# Patient Record
Sex: Female | Born: 1985 | Race: White | Hispanic: No | Marital: Married | Smoking: Former smoker
Health system: Southern US, Community
[De-identification: ages and names within clinical notes are randomized; demographics above are authoritative.]

## PROBLEM LIST (undated history)

## (undated) DIAGNOSIS — E282 Polycystic ovarian syndrome: Secondary | ICD-10-CM

## (undated) DIAGNOSIS — E039 Hypothyroidism, unspecified: Secondary | ICD-10-CM

## (undated) DIAGNOSIS — C73 Malignant neoplasm of thyroid gland: Secondary | ICD-10-CM

## (undated) DIAGNOSIS — E041 Nontoxic single thyroid nodule: Secondary | ICD-10-CM

---

## 2015-03-18 ENCOUNTER — Emergency Department (INDEPENDENT_AMBULATORY_CARE_PROVIDER_SITE_OTHER)
Admission: EM | Admit: 2015-03-18 | Discharge: 2015-03-18 | Disposition: A | Discharge status: Home / Self Care | Payer: BLUE CROSS/BLUE SHIELD | Source: Home / Self Care | Attending: Family Medicine | Admitting: Family Medicine

## 2015-03-18 ENCOUNTER — Encounter (HOSPITAL_COMMUNITY): Payer: Self-pay | Admitting: *Deleted

## 2015-03-18 DIAGNOSIS — S29012A Strain of muscle and tendon of back wall of thorax, initial encounter: Secondary | ICD-10-CM | POA: Diagnosis not present

## 2015-03-18 MED ORDER — NAPROXEN 375 MG PO TABS: 375.0000 mg | ORAL_TABLET | Freq: Two times a day (BID) | ORAL | Status: DC

## 2015-03-18 MED ORDER — TRAMADOL HCL 50 MG PO TABS
ORAL_TABLET | ORAL | Status: DC
Start: 2015-03-18 — End: 2016-03-16

## 2015-03-18 MED ORDER — DICLOFENAC SODIUM 1 % TD GEL: 1.0000 "application " | Freq: Four times a day (QID) | TRANSDERMAL | Status: DC

## 2015-03-18 NOTE — ED Provider Notes (Signed)
CSN: [id number]     Arrival date & time 03/18/15  1901 History   First MD Initiated Contact with Patient 03/18/15 2014     Chief Complaint  Patient presents with  . Back Pain   (Consider location/radiation/quality/duration/timing/severity/associated sxs/prior Treatment) HPI Comments: Awoke with back pain today. The pain is located in a small area to the right parathoracic musculature. 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 She believes this activity promoted the pain. The pain is exacerbated by taking a deep breath, bending over or lifting. Denies shortness of breath.    History reviewed. No pertinent past medical history. History reviewed. No pertinent past surgical history. History reviewed. No pertinent family history. Social History  Substance Use Topics  . Smoking status: None  . Smokeless tobacco: None  . Alcohol Use: None   OB History    No data available     Review of Systems  Constitutional: Positive for activity change. Negative for fever and fatigue.  HENT: Negative.   Respiratory: Negative for cough, chest tightness, shortness of breath and wheezing.        Pain to the right parathoracic musculature while taking a deep breath.  Cardiovascular: Negative.  Negative for chest pain.  Gastrointestinal: Negative.   Genitourinary: Negative.   Musculoskeletal: Positive for back pain. Negative for myalgias and gait problem.  Skin: Negative.  Negative for pallor and rash.  Neurological: Negative.   Psychiatric/Behavioral: Negative.     Allergies  Review of patient's allergies indicates no known allergies.  Home Medications   Prior to Admission medications   Medication Sig Start Date End Date Taking? Authorizing Provider  diclofenac sodium (VOLTAREN) 1 % GEL Apply 1 application topically 4 (four) times daily. 03/18/15   Janne Napoleon, NP  naproxen (NAPROSYN) 375 MG tablet Take 1 tablet (375 mg total) by mouth 2 (two) times daily.  03/18/15   Janne Napoleon, NP  traMADol (ULTRAM) 50 MG tablet 1-2 tabs po q 6 hr prn pain Maximum dose= 8 tablets per day 03/18/15   Janne Napoleon, NP   Meds Ordered and Administered this Visit  Medications - No data to display  BP 122/85 mmHg  Pulse 95  Temp(Src) 99.4 F (37.4 C) (Oral)  Resp 16  SpO2 100%  LMP 03/18/2015 No data found.   Physical Exam  Constitutional: She is oriented to person, place, and time. She appears well-developed and well-nourished. No distress.  HENT:  Head: Normocephalic and atraumatic.  Eyes: EOM are normal.  Neck: Normal range of motion. Neck supple.  Cardiovascular: Normal rate, regular rhythm and normal heart sounds.   Pulmonary/Chest: Effort normal and breath sounds normal. No respiratory distress. She has no wheezes. She has no rales.  There is reproducible tenderness to the musculature of the right parathoracic muscles at level TVIII-9 in 10. Pain is also reproduced with leaning forward and arching the back and with deep breath. Lungs are perfectly clear. No crackles or wheezing. Good air movement and good chest expansion. No rubs.  Musculoskeletal: Normal range of motion. She exhibits no edema.  Lymphadenopathy:    She has no cervical adenopathy.  Neurological: She is alert and oriented to person, place, and time. No cranial nerve deficit. She exhibits normal muscle tone.  Skin: Skin is warm and dry.  Psychiatric: She has a normal mood and affect. Her behavior is normal. Thought content normal.  Nursing note and vitals reviewed.   ED Course  Procedures (  including critical care time)  Labs Review Labs Reviewed - No data to display  Imaging Review No results found.   Visual Acuity Review  Right Eye Distance:   Left Eye Distance:   Bilateral Distance:    Right Eye Near:   Left Eye Near:    Bilateral Near:         MDM   1. Strain of muscle and tendon of back wall of thorax, initial encounter    USe heat Limit lifting, pulling and  other movement that exacerbates the pain for the next few days. Meds ordered this encounter  Medications  . diclofenac sodium (VOLTAREN) 1 % GEL    Sig: Apply 1 application topically 4 (four) times daily.    Dispense:  100 g    Refill:  0    Order Specific Question:  Supervising Provider    Answer:  Billy Fischer [phone]  . naproxen (NAPROSYN) 375 MG tablet    Sig: Take 1 tablet (375 mg total) by mouth 2 (two) times daily.    Dispense:  20 tablet    Refill:  0    Order Specific Question:  Supervising Provider    Answer:  Billy Fischer [phone]  . traMADol (ULTRAM) 50 MG tablet    Sig: 1-2 tabs po q 6 hr prn pain Maximum dose= 8 tablets per day    Dispense:  20 tablet    Refill:  0    Order Specific Question:  Supervising Provider    Answer:  Billy Fischer [[id number]]       Janne Napoleon, NP 03/18/15 2038

## 2015-03-18 NOTE — Discharge Instructions (Signed)
Muscle Strain USe heat Limit lifting, pulling and other movement that exacerbates the pain for the next few days. A muscle strain is an injury that occurs when a muscle is stretched beyond its normal length. Usually a small number of muscle fibers are torn when this happens. Muscle strain is rated in degrees. First-degree strains have the least amount of muscle fiber tearing and pain. Second-degree and third-degree strains have increasingly more tearing and pain.  Usually, recovery from muscle strain takes 1-2 weeks. Complete healing takes 5-6 weeks.  CAUSES  Muscle strain happens when a sudden, violent force placed on a muscle stretches it too far. This may occur with lifting, sports, or a fall.  RISK FACTORS Muscle strain is especially common in athletes.  SIGNS AND SYMPTOMS At the site of the muscle strain, there may be:  Pain.  Bruising.  Swelling.  Difficulty using the muscle due to pain or lack of normal function. DIAGNOSIS  Your health care provider will perform a physical exam and ask about your medical history. TREATMENT  Often, the best treatment for a muscle strain is resting, icing, and applying cold compresses to the injured area.  HOME CARE INSTRUCTIONS   Use the PRICE method of treatment to promote muscle healing during the first 2-3 days after your injury. The PRICE method involves:  Protecting the muscle from being injured again.  Restricting your activity and resting the injured body part.  moist heat packs.  Apply compression to the injured area with a splint or elastic bandage. Be careful not to wrap it too tightly. This may interfere with blood circulation or increase swelling.  Elevate the injured body part above the level of your heart as often as you can.  Only take over-the-counter or prescription medicines for pain, discomfort, or fever as directed by your health care provider.  Warming up prior to exercise helps to prevent future muscle  strains. SEEK MEDICAL CARE IF:   You have increasing pain or swelling in the injured area.  You have numbness, tingling, or a significant loss of strength in the injured area. MAKE SURE YOU:   Understand these instructions.  Will watch your condition.  Will get help right away if you are not doing well or get worse.   This information is not intended to replace advice given to you by your health care provider. Make sure you discuss any questions you have with your health care provider.   Document Released: 01/16/2005 Document Revised: 11/06/2012 Document Reviewed: 08/15/2012 Elsevier Interactive Patient Education [employer].

## 2015-03-18 NOTE — ED Notes (Signed)
Back   Pain      Woke up  Today   With  The  Symptoms             denies  Any  Urinary  Symptoms  Pt  Reports      Pain is  For  The most  Part  In one  Spot  Mid  Back

## 2016-03-16 ENCOUNTER — Encounter: Payer: Self-pay | Admitting: Family Medicine

## 2016-03-16 ENCOUNTER — Ambulatory Visit (INDEPENDENT_AMBULATORY_CARE_PROVIDER_SITE_OTHER): Payer: BLUE CROSS/BLUE SHIELD | Admitting: Family Medicine

## 2016-03-16 VITALS — BP 109/67 | HR 95 | Wt 169.5 lb

## 2016-03-16 DIAGNOSIS — Z01419 Encounter for gynecological examination (general) (routine) without abnormal findings: Secondary | ICD-10-CM | POA: Diagnosis not present

## 2016-03-16 DIAGNOSIS — E041 Nontoxic single thyroid nodule: Secondary | ICD-10-CM | POA: Insufficient documentation

## 2016-03-16 DIAGNOSIS — N911 Secondary amenorrhea: Secondary | ICD-10-CM | POA: Insufficient documentation

## 2016-03-16 DIAGNOSIS — Z0000 Encounter for general adult medical examination without abnormal findings: Secondary | ICD-10-CM

## 2016-03-16 DIAGNOSIS — Z01411 Encounter for gynecological examination (general) (routine) with abnormal findings: Secondary | ICD-10-CM

## 2016-03-16 DIAGNOSIS — Z124 Encounter for screening for malignant neoplasm of cervix: Secondary | ICD-10-CM

## 2016-03-16 LAB — COMPREHENSIVE METABOLIC PANEL
ALT: 15 U/L (ref 6–29)
AST: 20 U/L (ref 10–30)
Albumin: 4 g/dL (ref 3.6–5.1)
Alkaline Phosphatase: 69 U/L (ref 33–115)
BUN: 10 mg/dL (ref 7–25)
CHLORIDE: 104 mmol/L (ref 98–110)
CO2: 26 mmol/L (ref 20–31)
CREATININE: 0.61 mg/dL (ref 0.50–1.10)
Calcium: 9.2 mg/dL (ref 8.6–10.2)
GLUCOSE: 80 mg/dL (ref 65–99)
Potassium: 4.4 mmol/L (ref 3.5–5.3)
SODIUM: 139 mmol/L (ref 135–146)
Total Bilirubin: 0.2 mg/dL (ref 0.2–1.2)
Total Protein: 7.6 g/dL (ref 6.1–8.1)

## 2016-03-16 LAB — LIPID PANEL
Cholesterol: 161 mg/dL (ref <200)
HDL: 30 mg/dL — AB (ref >50)
LDL CALC: 102 mg/dL — AB (ref <100)
Total CHOL/HDL Ratio: 5.4 Ratio — ABNORMAL HIGH (ref <5.0)
Triglycerides: 143 mg/dL (ref <150)
VLDL: 29 mg/dL (ref <30)

## 2016-03-16 LAB — CBC
HEMATOCRIT: 38.9 % (ref 35.0–45.0)
Hemoglobin: 12.6 g/dL (ref 11.7–15.5)
MCH: 27.8 pg (ref 27.0–33.0)
MCHC: 32.4 g/dL (ref 32.0–36.0)
MCV: 85.9 fL (ref 80.0–100.0)
MPV: 10.3 fL (ref 7.5–12.5)
Platelets: 346 10*3/uL (ref 140–400)
RBC: 4.53 MIL/uL (ref 3.80–5.10)
RDW: 13.6 % (ref 11.0–15.0)
WBC: 6.7 10*3/uL (ref 3.8–10.8)

## 2016-03-16 LAB — TSH: TSH: 0.98 m[IU]/L

## 2016-03-16 NOTE — Assessment & Plan Note (Signed)
Will check labs. Reports normal pelvic sono in last few years, without ovarian findings.

## 2016-03-16 NOTE — Progress Notes (Signed)
Subjective:     Linda Edwards is a 31 y.o. female and is here for a comprehensive physical exam. The patient reports problems - irregular menses. Reports cycles started at age 5. Initially regular. On OC's x 10 years. Cycles started spacing out to q 2-3 months. More recently has gone up to 1 year with no cycle. Reports previous w/u with OB/GYN that showed low estrogen. Placed on some medication which started her cycle. Stated she could not be on it longer than 6 months as it would lead to endometrial cancer. She has no family history of premature menopause. Has always been slightly overweight. No heavy athletics. Denies hot flashes.  Social History   Social History  . Marital status: Married    Spouse name: N/A  . Number of children: N/A  . Years of education: N/A   Occupational History  . Not on file.   Social History Main Topics  . Smoking status: Not on file  . Smokeless tobacco: Not on file  . Alcohol use Not on file  . Drug use: Unknown  . Sexual activity: Yes    Birth control/ protection: None   Other Topics Concern  . Not on file   Social History Narrative  . No narrative on file   Health Maintenance  Topic Date Due  . HIV Screening  02/19/2001  . TETANUS/TDAP  02/19/2005  . PAP SMEAR  02/20/2007  . INFLUENZA VACCINE  08/31/2015    The following portions of the patient's history were reviewed and updated as appropriate: allergies, current medications, past family history, past medical history, past social history, past surgical history and problem list.  Review of Systems Pertinent items noted in HPI and remainder of comprehensive ROS otherwise negative.   Objective:    BP 109/67   Pulse 95   Wt 169 lb 8 oz (76.9 kg)  General appearance: alert, cooperative and appears stated age Head: Normocephalic, without obvious abnormality, atraumatic Neck: no adenopathy, supple, symmetrical, trachea midline and thyroid: solitary nodule right side and 2-3 cm Lungs:  clear to auscultation bilaterally Breasts: normal appearance, no masses or tenderness Heart: regular rate and rhythm, S1, S2 normal, no murmur, click, rub or gallop Abdomen: soft, non-tender; bowel sounds normal; no masses,  no organomegaly Pelvic: cervix normal in appearance, external genitalia normal, no adnexal masses or tenderness, no cervical motion tenderness, uterus normal size, shape, and consistency and vagina normal without discharge Extremities: extremities normal, atraumatic, no cyanosis or edema Pulses: 2+ and symmetric Skin: Skin color, texture, turgor normal. No rashes or lesions Lymph nodes: Cervical, supraclavicular, and axillary nodes normal. Neurologic: Grossly normal    Assessment:    GYN female exam.      Plan:   Problem List Items Addressed This Visit      Unprioritized   Secondary amenorrhea - Primary    Will check labs. Reports normal pelvic sono in last few years, without ovarian findings.      Relevant Orders   Follicle stimulating hormone   TSH   Prolactin   Thyroid nodule    Will check sono--likely needs biopsy      Relevant Orders   US THYROID    Other Visit Diagnoses    Encounter for gynecological examination with abnormal finding       Relevant Orders   Comprehensive metabolic panel   CBC   Lipid panel   Screening for cervical cancer       Relevant Orders   Cytology - PAP  Declines flu See After Visit Summary for Counseling Recommendations

## 2016-03-16 NOTE — Assessment & Plan Note (Signed)
Will check sono--likely needs biopsy

## 2016-03-16 NOTE — Patient Instructions (Signed)
Secondary Amenorrhea Secondary amenorrhea is the stopping of menstrual flow for 3-6 months in a female who has previously had periods. There are many possible causes. Most of these causes are not serious. Usually, treating the underlying problem causing the loss of menses will return your periods to normal. What are the causes? Some common and uncommon causes of not menstruating include:  Malnutrition.  Low blood sugar (hypoglycemia).  Polycystic ovary disease.  Stress or fear.  Breastfeeding.  Hormone imbalance.  Ovarian failure.  Medicines.  Extreme obesity.  Cystic fibrosis.  Low body weight or drastic weight reduction from any cause.  Early menopause.  Removal of ovaries or uterus.  Contraceptives.  Illness.  Long-term (chronic) illnesses.  Cushing syndrome.  Thyroid problems.  Birth control pills, patches, or vaginal rings for birth control. What increases the risk? You may be at greater risk of secondary amenorrhea if:  You have a family history of this condition.  You have an eating disorder.  You do athletic training. How is this diagnosed? A diagnosis is made by your health care provider taking a medical history and doing a physical exam. This will include a pelvic exam to check for problems with your reproductive organs. Pregnancy must be ruled out. Often, numerous blood tests are done to measure different hormones in the body. Urine testing may be done. Specialized exams (ultrasound, CT scan, MRI, or hysteroscopy) may have to be done as well as measuring the body mass index (BMI). How is this treated? Treatment depends on the cause of the amenorrhea. If an eating disorder is present, this can be treated with an adequate diet and therapy. Chronic illnesses may improve with treatment of the illness. Amenorrhea may be corrected with medicines, lifestyle changes, or surgery. If the amenorrhea cannot be corrected, it is sometimes possible to create a false  menstruation with medicines. Follow these instructions at home:  Maintain a healthy diet.  Manage weight problems.  Exercise regularly but not excessively.  Get adequate sleep.  Manage stress.  Be aware of changes in your menstrual cycle. Keep a record of when your periods occur. Note the date your period starts, how long it lasts, and any problems. Contact a health care provider if: Your symptoms do not get better with treatment. This information is not intended to replace advice given to you by your health care provider. Make sure you discuss any questions you have with your health care provider. Document Released: 02/27/2006 Document Revised: 06/24/2015 Document Reviewed: 07/04/2012 Elsevier Interactive Patient Education  2017 [employer]. Preventive Care 18-39 Years, Female Preventive care refers to lifestyle choices and visits with your health care provider that can promote health and wellness. What does preventive care include?  A yearly physical exam. This is also called an annual well check.  Dental exams once or twice a year.  Routine eye exams. Ask your health care provider how often you should have your eyes checked.  Personal lifestyle choices, including:  Daily care of your teeth and gums.  Regular physical activity.  Eating a healthy diet.  Avoiding tobacco and drug use.  Limiting alcohol use.  Practicing safe sex.  Taking vitamin and mineral supplements as recommended by your health care provider. What happens during an annual well check? The services and screenings done by your health care provider during your annual well check will depend on your age, overall health, lifestyle risk factors, and family history of disease. Counseling  Your health care provider may ask you questions about your:  Alcohol use.  Tobacco use.  Drug use.  Emotional well-being.  Home and relationship well-being.  Sexual activity.  Eating habits.  Work and work  Statistician.  Method of birth control.  Menstrual cycle.  Pregnancy history. Screening  You may have the following tests or measurements:  Height, weight, and BMI.  Diabetes screening. This is done by checking your blood sugar (glucose) after you have not eaten for a while (fasting).  Blood pressure.  Lipid and cholesterol levels. These may be checked every 5 years starting at age 49.  Skin check.  Hepatitis C blood test.  Hepatitis B blood test.  Sexually transmitted disease (STD) testing.  BRCA-related cancer screening. This may be done if you have a family history of breast, ovarian, tubal, or peritoneal cancers.  Pelvic exam and Pap test. This may be done every 3 years starting at age 6. Starting at age 64, this may be done every 5 years if you have a Pap test in combination with an HPV test. Discuss your test results, treatment options, and if necessary, the need for more tests with your health care provider. Vaccines  Your health care provider may recommend certain vaccines, such as:  Influenza vaccine. This is recommended every year.  Tetanus, diphtheria, and acellular pertussis (Tdap, Td) vaccine. You may need a Td booster every 10 years.  Varicella vaccine. You may need this if you have not been vaccinated.  HPV vaccine. If you are 39 or younger, you may need three doses over 6 months.  Measles, mumps, and rubella (MMR) vaccine. You may need at least one dose of MMR. You may also need a second dose.  Pneumococcal 13-valent conjugate (PCV13) vaccine. You may need this if you have certain conditions and were not previously vaccinated.  Pneumococcal polysaccharide (PPSV23) vaccine. You may need one or two doses if you smoke cigarettes or if you have certain conditions.  Meningococcal vaccine. One dose is recommended if you are age 28-21 years and a first-year college student living in a residence hall, or if you have one of several medical conditions. You may  also need additional booster doses.  Hepatitis A vaccine. You may need this if you have certain conditions or if you travel or work in places where you may be exposed to hepatitis A.  Hepatitis B vaccine. You may need this if you have certain conditions or if you travel or work in places where you may be exposed to hepatitis B.  Haemophilus influenzae type b (Hib) vaccine. You may need this if you have certain risk factors. Talk to your health care provider about which screenings and vaccines you need and how often you need them. This information is not intended to replace advice given to you by your health care provider. Make sure you discuss any questions you have with your health care provider. Document Released: 03/14/2001 Document Revised: 10/06/2015 Document Reviewed: 11/17/2014 Elsevier Interactive Patient Education  2017 [employer].

## 2016-03-16 NOTE — Progress Notes (Signed)
Subjective:     Patient ID: Linda Edwards, female   DOB: 1986-01-01, 31 y.o.   MRN: [id number]  HPI Linda Edwards is a 31 yo woman presenting for annual check up and pap smear.  Her last pap smear was 2-3 years ago and she has never had an abnormal pap smear.  She denies vaginal discharge, irritation, dysuria, increased urinary frequency, abdominal pain or hot flashes.  She also c/o irregular periods for the past 4-5 years.  Initially she had a period every 2-3 months but overtime the interval between cycles has increased to 1 year.  She had menarche age at 65-14 and initially had regular periods.  She was on OCP between age [age] but discontinued them once her cycles became irregular.  Her LMP began 03/09/16 and the period prior to that was in 03/2015.  Her periods tend to be light when they occur, but her most recent one began with heavy bleeding and cramping for 3 days.  She had a workup with her OB in [place] 4-5 years ago when her irregularity began and was told she had low estrogen.  She was given a medication that she cannot recall the name of that put her back on regular cycles for a few months.  She has since discontinued that medication.  She had normal U/S of her RLQ abdomen and ovaries several years ago to work up RLQ abdominal pain that has since resolved without any intervention.  Pt is currently sexually active in a monogamous relationship with her husband and uses no barrier protection or contraception.  She has never been pregnant.  She is currently not hoping to become pregnant and would prefer not to take medications to help with fertility.  Review of Systems     Objective:   Physical Exam Gen: comfortable, in NAD Neck: enlarged thyroid nodule R > L, non-tender to palpation, mobile with swallowing Cv: RRR, no murmur, rubs or gallops Pulm: clear to auscultation bilaterally, no increased WOB, no crackles or wheezes. Abd: normoactive bowel sounds, non-distended, non-tender to  palpation, no hepatosplenomegaly Breast: normal external appearance, no lymphadenopathy, no masses or lesions noted Pelvic: normal external female genitalia, small bleeding from cerical os, normal vaginal discharge    Assessment:     Linda Edwards is a 31 yo woman presenting for establishment of care, pap smear and evaluation of irregular periods.  Differential for her secondary amenorrhea includes thyroid disorder, PCOS and pituitary dysfunction.  Enlarged thyroid on exam supports thyroid problems, but further workup is necessary to narrow the differential.    Plan:     -Check CBC, CMP, lipid panel -Check FSH, TSH, prolactin to work up irregular cycles -U/S of thyroid to work up enlarged thyroid on exam -Performed breast exam, pelvic exam and pap smear today

## 2016-03-17 LAB — FOLLICLE STIMULATING HORMONE: FSH: 3.5 m[IU]/mL

## 2016-03-17 LAB — PROLACTIN: PROLACTIN: 6.2 ng/mL

## 2016-03-20 LAB — CYTOLOGY - PAP
DIAGNOSIS: NEGATIVE
HPV (WINDOPATH): NOT DETECTED

## 2016-03-22 ENCOUNTER — Ambulatory Visit (HOSPITAL_COMMUNITY)
Admission: RE | Admit: 2016-03-22 | Discharge: 2016-03-22 | Disposition: A | Discharge status: Home / Self Care | Payer: BLUE CROSS/BLUE SHIELD | Source: Ambulatory Visit | Attending: Family Medicine | Admitting: Family Medicine

## 2016-03-22 DIAGNOSIS — E041 Nontoxic single thyroid nodule: Secondary | ICD-10-CM

## 2016-03-22 DIAGNOSIS — E042 Nontoxic multinodular goiter: Secondary | ICD-10-CM | POA: Diagnosis not present

## 2016-03-23 ENCOUNTER — Telehealth: Payer: Self-pay | Admitting: General Practice

## 2016-03-23 DIAGNOSIS — N911 Secondary amenorrhea: Secondary | ICD-10-CM

## 2016-03-23 DIAGNOSIS — E049 Nontoxic goiter, unspecified: Secondary | ICD-10-CM

## 2016-03-23 NOTE — Telephone Encounter (Signed)
Per Dr Kennon Rounds, patient needs referral to ENT. Scheduled with Dr Kasandra Knudsen Jonathon Bellows Teoh on 3/6 @ 1pm. Called and informed patient. Patient verbalized understanding and asked what her labs results were, if she needed a pelvic ultrasound and if she should come back to see [place] on 3/8 still. Told patient I was not sure but I would message Dr Kennon Rounds and call her back whenever I hear something. Patient verbalized understanding and had no questions

## 2016-03-23 NOTE — Telephone Encounter (Signed)
Per Dr Kennon Rounds, patient's labs appear normal and she should have a pelvic ultrasound prior to her next appt in our office. Scheduled ultrasound for 2/28 1115. Called patient and informed her of all the above. Patient verbalized understanding to all & had no questions

## 2016-03-27 ENCOUNTER — Other Ambulatory Visit: Payer: Self-pay | Admitting: General Practice

## 2016-03-27 DIAGNOSIS — N911 Secondary amenorrhea: Secondary | ICD-10-CM

## 2016-03-28 ENCOUNTER — Encounter: Payer: Self-pay | Admitting: Family Medicine

## 2016-03-28 ENCOUNTER — Ambulatory Visit (HOSPITAL_COMMUNITY)
Admission: RE | Admit: 2016-03-28 | Discharge: 2016-03-28 | Disposition: A | Discharge status: Home / Self Care | Payer: BLUE CROSS/BLUE SHIELD | Source: Ambulatory Visit | Attending: Family Medicine | Admitting: Family Medicine

## 2016-03-28 DIAGNOSIS — N911 Secondary amenorrhea: Secondary | ICD-10-CM | POA: Diagnosis not present

## 2016-03-28 DIAGNOSIS — E282 Polycystic ovarian syndrome: Secondary | ICD-10-CM | POA: Insufficient documentation

## 2016-03-28 DIAGNOSIS — N912 Amenorrhea, unspecified: Secondary | ICD-10-CM | POA: Diagnosis not present

## 2016-03-28 DIAGNOSIS — N854 Malposition of uterus: Secondary | ICD-10-CM | POA: Diagnosis not present

## 2016-03-28 DIAGNOSIS — N926 Irregular menstruation, unspecified: Secondary | ICD-10-CM | POA: Insufficient documentation

## 2016-03-29 ENCOUNTER — Ambulatory Visit (HOSPITAL_COMMUNITY): Payer: BLUE CROSS/BLUE SHIELD

## 2016-04-04 ENCOUNTER — Other Ambulatory Visit (INDEPENDENT_AMBULATORY_CARE_PROVIDER_SITE_OTHER): Payer: Self-pay | Admitting: Otolaryngology

## 2016-04-04 DIAGNOSIS — E041 Nontoxic single thyroid nodule: Secondary | ICD-10-CM

## 2016-04-06 ENCOUNTER — Encounter: Payer: Self-pay | Admitting: Family Medicine

## 2016-04-06 ENCOUNTER — Ambulatory Visit (INDEPENDENT_AMBULATORY_CARE_PROVIDER_SITE_OTHER): Payer: BLUE CROSS/BLUE SHIELD | Admitting: Family Medicine

## 2016-04-06 VITALS — BP 120/64 | HR 69 | Ht 62.0 in | Wt 170.6 lb

## 2016-04-06 DIAGNOSIS — E282 Polycystic ovarian syndrome: Secondary | ICD-10-CM

## 2016-04-06 MED ORDER — MEDROXYPROGESTERONE ACETATE 10 MG PO TABS: 10.0000 mg | ORAL_TABLET | Freq: Every day | ORAL | 3 refills | Status: DC

## 2016-04-06 NOTE — Patient Instructions (Signed)
Polycystic Kidney Disease, Adult Polycystic kidney disease is a disease in which the kidneys grow many small fluid-filled cysts. These cysts squeeze healthy kidney tissue, hurting the function of the kidneys. Polycystic kidney disease is present at birth and often causes progressive loss of kidney function and high blood pressure (hypertension). In milder forms of the disease, kidney function may be adequate throughout life. What are the causes? The condition is usually inherited from parents. What are the signs or symptoms?  Hypertension.  Not enough healthy red blood cells to carry adequate oxygen to your tissues (anemia).  Pain in middle and side of the back below ribs (flank pain). This can happen if a cyst is bleeding.  Blood in the urine.  Kidney failure.  Kidney stones.  Increased urination at night.  Liver disease and liver cysts. How is this diagnosed? Your health care provider will ask you about your history and symptoms and perform a physical exam. Tests may be done to diagnose the condition. They may include an ultrasound, a CT scan, or an MRI scan. Sometimes chromosome studies are done to see if you have the genes to have polycystic kidneys. How is this treated?  There is no treatment at this time to keep cysts from forming or getting larger.  Cysts may need to be drained with a needle if they are large, putting pressure on other organs, and further destroying kidney tissue. This may require surgery.  Hypertension may be treated with medicines. Treating hypertension slows down further damage to the kidneys.  If kidney failure occurs, dialysis or transplantation is used to treat the disease. Follow these instructions at home: You should follow up with a kidney specialist (nephrologist) so that your kidney function is monitored. Contact a health care provider if:  You have severe pain in the flank area.  You have blood in your urine. This information is not intended  to replace advice given to you by your health care provider. Make sure you discuss any questions you have with your health care provider. Document Released: 10/11/2000 Document Revised: 06/24/2015 Document Reviewed: 06/20/2012 Elsevier Interactive Patient Education  2017 [employer] Inc[employer]  

## 2016-04-07 NOTE — Assessment & Plan Note (Signed)
Lengthy discussion had with pt. to explain PCOS, diagrams and verbal communication used to show impact on ovaries, endometrium, need for endometrial protection, impact on fertility, medical treatments to achieve necessary endpoints.  Should be on cyclical progestin vs. OC's if not trying to get pregnant.  Should be on Femara or clomid if trying to conceive. Desires cyclic progestin at this time.

## 2016-04-07 NOTE — Progress Notes (Signed)
   Subjective:    Patient ID: Linda Edwards is a 31 y.o. female presenting with Follow-up  on 04/06/2016  HPI: Patient here to f/u previous annual. Since last visit, has had full cycle which was heavy and painful. Pelvic sono supported PCOS as did labs.   Review of Systems  Constitutional: Negative for chills and fever.  Respiratory: Negative for shortness of breath.   Cardiovascular: Negative for chest pain.  Gastrointestinal: Negative for abdominal pain, nausea and vomiting.  Genitourinary: Negative for dysuria.  Skin: Negative for rash.      Objective:    BP 120/64   Pulse 69   Ht 5\' 2"  (1.575 m)   Wt 170 lb 9.6 oz (77.4 kg)   BMI 31.20 kg/m  Physical Exam  Constitutional: She is oriented to person, place, and time. She appears well-developed and well-nourished. No distress.  HENT:  Head: Normocephalic and atraumatic.  Eyes: No scleral icterus.  Neck: Neck supple.  Cardiovascular: Normal rate.   Pulmonary/Chest: Effort normal.  Abdominal: Soft.  Neurological: She is alert and oriented to person, place, and time.  Skin: Skin is warm and dry.  Psychiatric: She has a normal mood and affect.        Assessment & Plan:   Problem List Items Addressed This Visit      Unprioritized   PCOS (polycystic ovarian syndrome) - Primary    Lengthy discussion had with pt. to explain PCOS, diagrams and verbal communication used to show impact on ovaries, endometrium, need for endometrial protection, impact on fertility, medical treatments to achieve necessary endpoints.  Should be on cyclical progestin vs. OC's if not trying to get pregnant.  Should be on Femara or clomid if trying to conceive. Desires cyclic progestin at this time.       Relevant Medications   medroxyPROGESTERone (PROVERA) 10 MG tablet      Total face-to-face time with patient: 15 minutes. Over 50% of encounter was spent on counseling and coordination of care. Return in about 3 months (around  07/07/2016).  Donnamae Jude 04/07/2016 9:25 AM

## 2016-04-13 ENCOUNTER — Ambulatory Visit
Admission: RE | Admit: 2016-04-13 | Discharge: 2016-04-13 | Disposition: A | Discharge status: Home / Self Care | Payer: BLUE CROSS/BLUE SHIELD | Source: Ambulatory Visit | Attending: Otolaryngology | Admitting: Otolaryngology

## 2016-04-13 ENCOUNTER — Other Ambulatory Visit (HOSPITAL_COMMUNITY)
Admission: RE | Admit: 2016-04-13 | Discharge: 2016-04-13 | Disposition: A | Discharge status: Home / Self Care | Payer: BLUE CROSS/BLUE SHIELD | Source: Ambulatory Visit | Attending: Radiology | Admitting: Radiology

## 2016-04-13 DIAGNOSIS — E041 Nontoxic single thyroid nodule: Secondary | ICD-10-CM | POA: Diagnosis not present

## 2016-04-13 DIAGNOSIS — E079 Disorder of thyroid, unspecified: Secondary | ICD-10-CM | POA: Diagnosis not present

## 2016-04-13 DIAGNOSIS — R896 Abnormal cytological findings in specimens from other organs, systems and tissues: Secondary | ICD-10-CM | POA: Insufficient documentation

## 2016-04-13 IMAGING — US US THYROID BIOPSY
1 series · 13 of 13 positions shown · non-contrast
Comparison: US Thyroid [DATE]

MEDICATIONS:
10 cc 1% lidocaine

COMPLICATIONS:
None immediate.

INDICATION: Indeterminate thyroid nodule

5 cm right thyroid nodule biopsy
EXAM:
ULTRASOUND GUIDED FINE NEEDLE ASPIRATION OF INDETERMINATE THYROID
NODULE
TECHNIQUE: Informed written consent was obtained from the patient after a
discussion of the risks, benefits and alternatives to treatment.
Questions regarding the procedure were encouraged and answered. A
timeout was performed prior to the initiation of the procedure.

[Series 1: us thyroid biopsy · 0.08mm/px · 13 acquisitions, 13 frames shown]
[im 1/13]
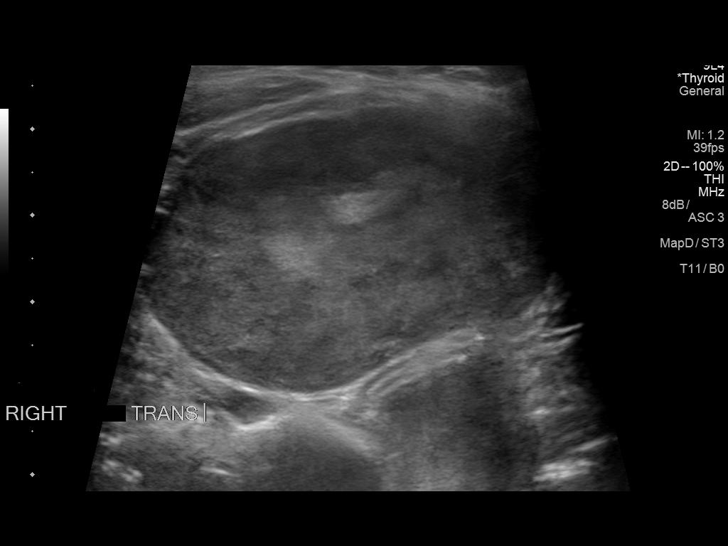
[im 2/13]
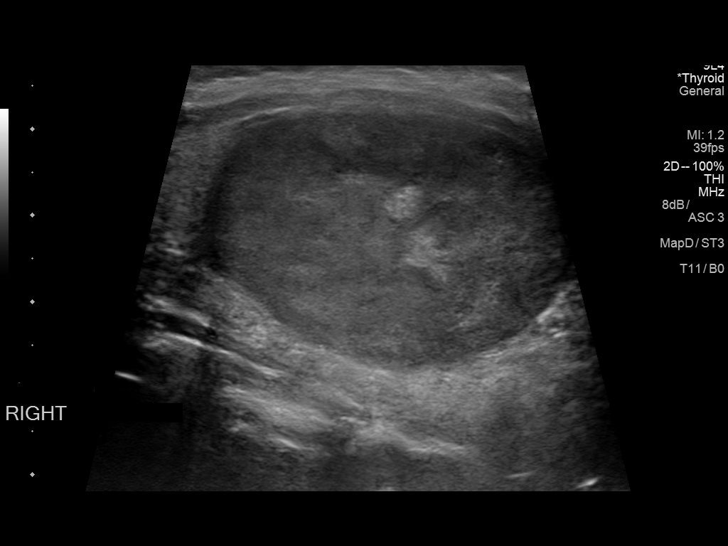
[im 3/13]
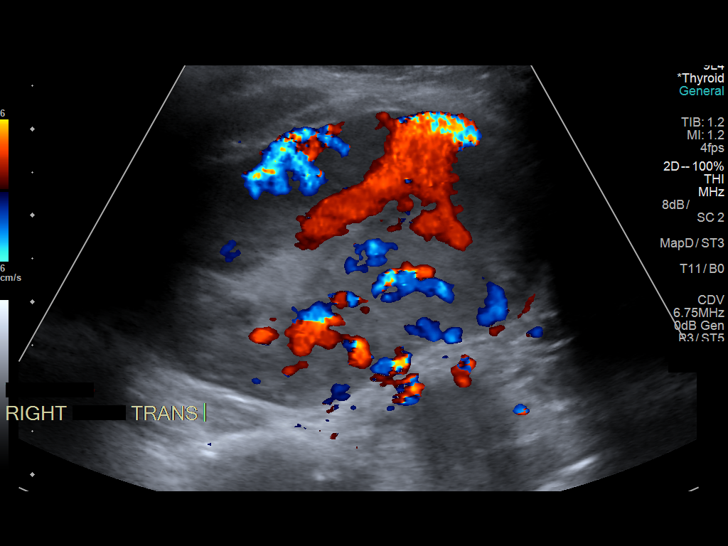
[im 4/13]
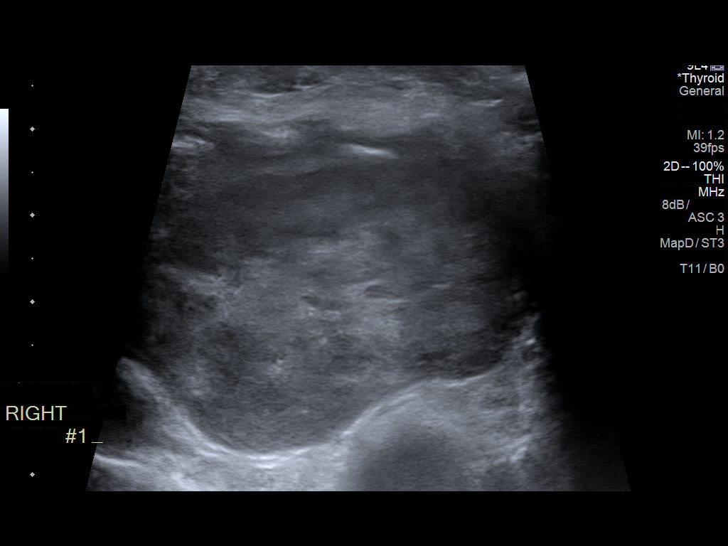
[im 5/13]
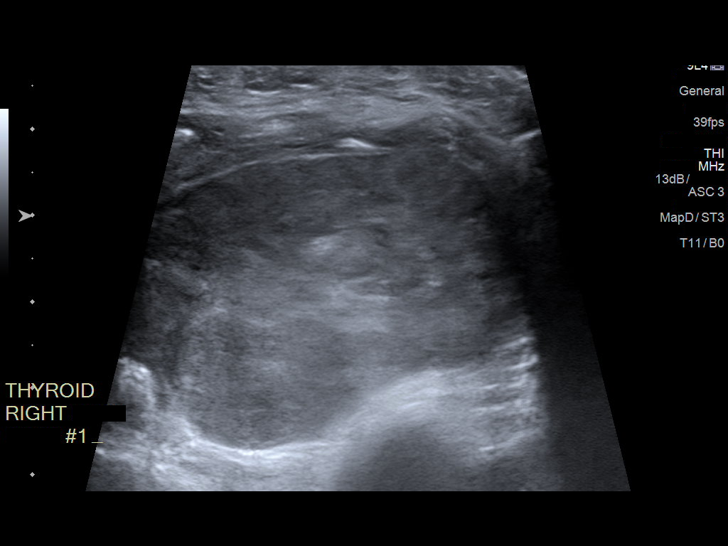
[im 6/13]
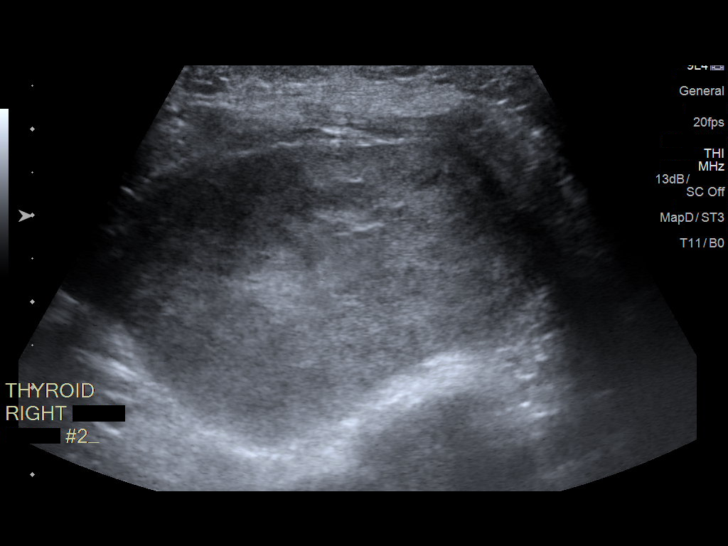
[im 7/13]
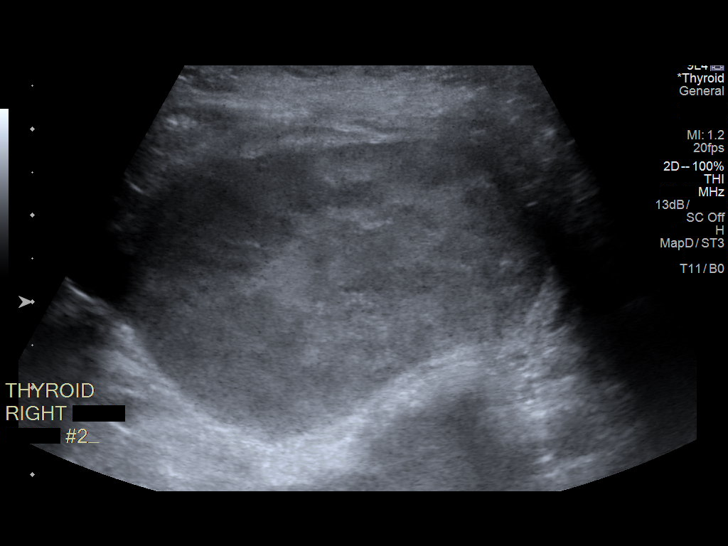
[im 8/13]
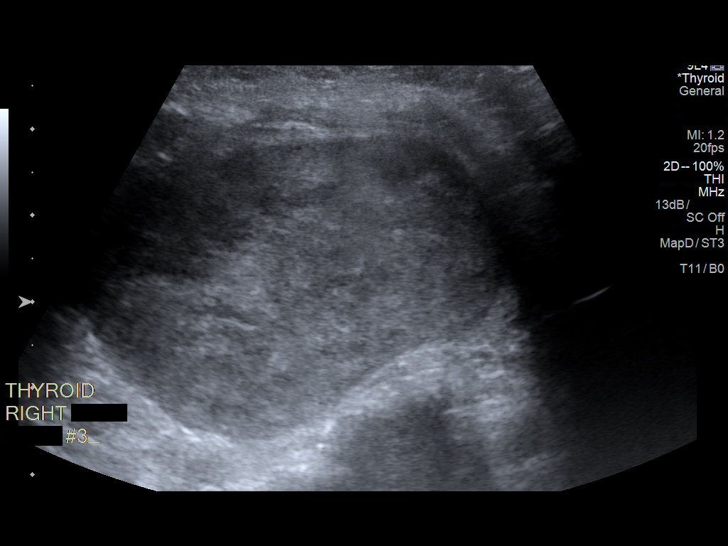
[im 9/13]
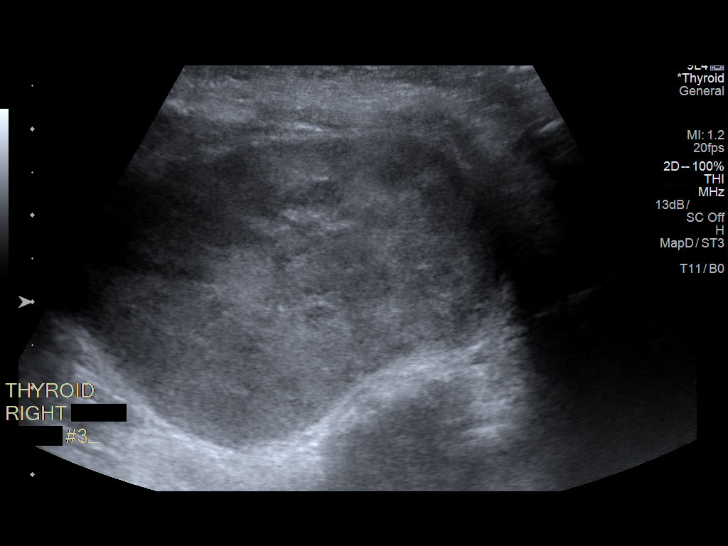
[im 10/13]
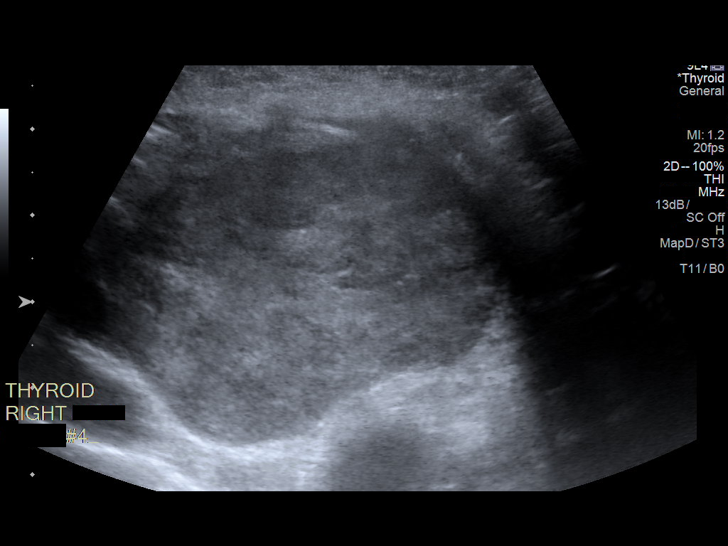
[im 11/13]
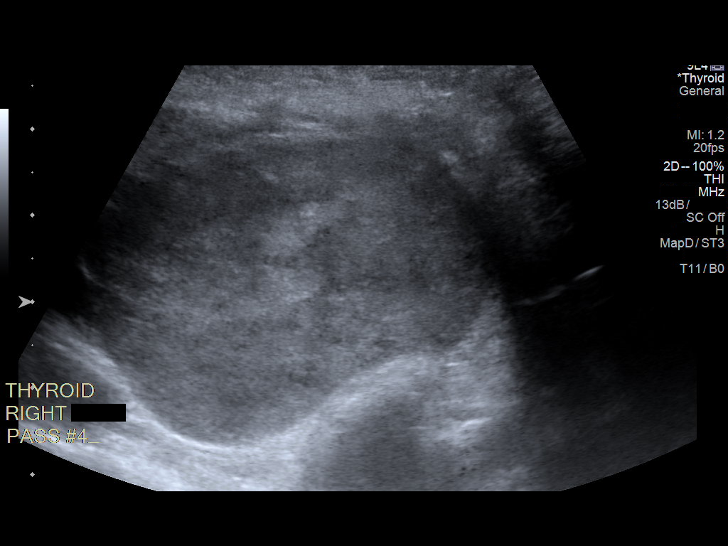
[im 12/13]
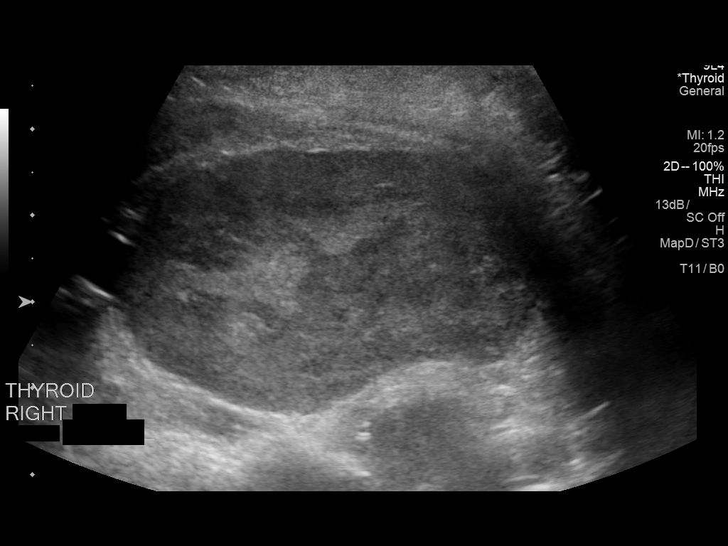
[im 13/13]
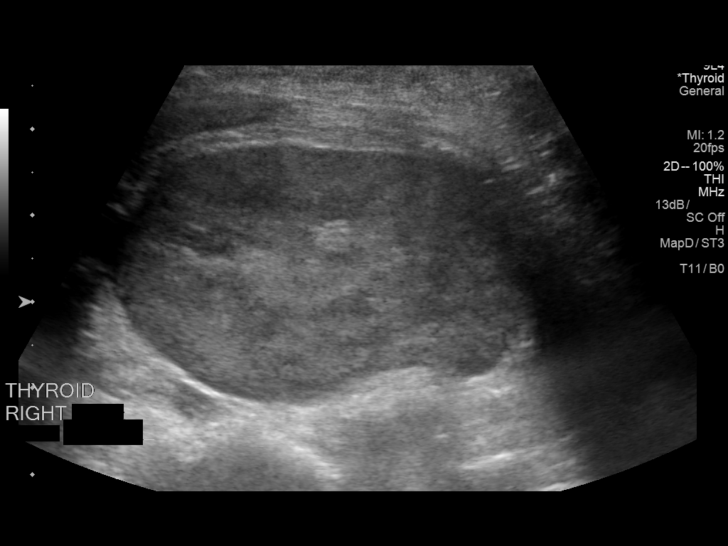

[13 of 13 positions shown; findings below may reference images not displayed]

Pre-procedural ultrasound scanning demonstrated unchanged size and
appearance of the indeterminate nodule within the right inferior
thyroid

The procedure was planned. The neck was prepped in the usual sterile
fashion, and a sterile drape was applied covering the operative
field. A timeout was performed prior to the initiation of the
procedure. Local anesthesia was provided with 1% lidocaine.

Under direct ultrasound guidance, 4 FNA biopsies were performed of
the right inferior thyroid nodule with a 25 gauge needle. Multiple
ultrasound images were saved for procedural documentation purposes.
The samples were prepared and submitted to pathology.

Limited post procedural scanning was negative for hematoma or
additional complication. Dressings were placed. The patient
tolerated the above procedures procedure well without immediate
postprocedural complication.
FINDINGS: FINDINGS
Nodule reference number based on prior diagnostic ultrasound: 2

Maximum size:  5.0 cm

Location: Right; Inferior

ACR TI-RADS risk category: TR3 (3 points)

Reason for biopsy: meets ACR TI-RADS criteria

Ultrasound imaging confirms appropriate placement of the needles
within the thyroid nodule.
IMPRESSION: Technically successful ultrasound guided fine needle aspiration of
right inferior thyroid nodule

Read by

JUMPER

## 2016-04-19 ENCOUNTER — Telehealth: Payer: Self-pay | Admitting: *Deleted

## 2016-04-19 NOTE — Telephone Encounter (Signed)
Pt left message stating that she was prescribed Provera and has questions about how to take it. Please call back.

## 2016-04-20 ENCOUNTER — Other Ambulatory Visit: Payer: BLUE CROSS/BLUE SHIELD

## 2016-04-20 NOTE — Telephone Encounter (Signed)
Called patient back and she states she isn't sure if she is supposed to get her period during the pills or after. Told patient she takes the pills 10 days out of the month then she will get a period 3-4 days later and she has refills for future months. Patient verbalized understanding & states she got a bill for her blood work but her insurance is supposed to cover preventative blood work. Explained to patient that the CBC, lipid panel and CMP could be considered as preventative but that [place], TSH, and prolactin would be diagnostic. Patient verbalized understanding & had no questions

## 2016-04-25 DIAGNOSIS — D440 Neoplasm of uncertain behavior of thyroid gland: Secondary | ICD-10-CM | POA: Diagnosis not present

## 2016-05-03 ENCOUNTER — Telehealth: Payer: Self-pay | Admitting: *Deleted

## 2016-05-03 NOTE — Telephone Encounter (Signed)
Pt left message yesterday stating that she got a letter from her insurance company asking her to contact her doctor's office to request a prescription for 90 day supply of recently prescribed medication.  She currently has 3 refills left.

## 2016-05-10 NOTE — Telephone Encounter (Signed)
Called patient, no answer- left message stating we are trying to reach you to return your phone call, if you still need assistance please call us back 

## 2016-05-31 DIAGNOSIS — R1312 Dysphagia, oropharyngeal phase: Secondary | ICD-10-CM | POA: Diagnosis not present

## 2016-05-31 DIAGNOSIS — D440 Neoplasm of uncertain behavior of thyroid gland: Secondary | ICD-10-CM | POA: Diagnosis not present

## 2016-06-06 ENCOUNTER — Other Ambulatory Visit: Payer: Self-pay | Admitting: Otolaryngology

## 2016-06-30 DIAGNOSIS — E041 Nontoxic single thyroid nodule: Secondary | ICD-10-CM

## 2016-06-30 HISTORY — DX: Nontoxic single thyroid nodule: E04.1

## 2016-07-01 ENCOUNTER — Encounter (HOSPITAL_COMMUNITY): Payer: Self-pay

## 2016-07-01 DIAGNOSIS — R6884 Jaw pain: Secondary | ICD-10-CM | POA: Diagnosis present

## 2016-07-01 DIAGNOSIS — Z5321 Procedure and treatment not carried out due to patient leaving prior to being seen by health care provider: Secondary | ICD-10-CM | POA: Diagnosis not present

## 2016-07-01 DIAGNOSIS — R51 Headache: Secondary | ICD-10-CM | POA: Diagnosis not present

## 2016-07-01 NOTE — ED Triage Notes (Signed)
Pt complaining of R jaw pain. Pt states has mass on thyroid, is scheduled for removal. Pt denies any chest pain or SOB. Pt denies any lightheadedness or dizziness. Pt denies any injury/trauma.

## 2016-07-02 ENCOUNTER — Emergency Department (HOSPITAL_COMMUNITY)
Admission: EM | Admit: 2016-07-02 | Discharge: 2016-07-02 | Discharge status: Against Medical Advice | Payer: BLUE CROSS/BLUE SHIELD | Attending: Emergency Medicine | Admitting: Emergency Medicine

## 2016-07-02 DIAGNOSIS — R51 Headache: Secondary | ICD-10-CM

## 2016-07-02 DIAGNOSIS — R519 Headache, unspecified: Secondary | ICD-10-CM

## 2016-07-02 NOTE — ED Notes (Signed)
Pt called in waiting room multiple times to go to a room and no response.

## 2016-07-02 NOTE — ED Provider Notes (Signed)
I did not see or evaluate this patient. They left after triage   Jola Schmidt, MD 07/02/16 [phone]

## 2016-07-03 ENCOUNTER — Encounter: Payer: Self-pay | Admitting: Family Medicine

## 2016-07-03 ENCOUNTER — Ambulatory Visit (INDEPENDENT_AMBULATORY_CARE_PROVIDER_SITE_OTHER): Payer: BLUE CROSS/BLUE SHIELD | Admitting: Family Medicine

## 2016-07-03 VITALS — BP 123/80 | HR 92 | Ht 61.0 in | Wt 172.4 lb

## 2016-07-03 DIAGNOSIS — E041 Nontoxic single thyroid nodule: Secondary | ICD-10-CM | POA: Diagnosis not present

## 2016-07-03 DIAGNOSIS — E282 Polycystic ovarian syndrome: Secondary | ICD-10-CM

## 2016-07-03 DIAGNOSIS — R599 Enlarged lymph nodes, unspecified: Secondary | ICD-10-CM | POA: Insufficient documentation

## 2016-07-03 HISTORY — DX: Enlarged lymph nodes, unspecified: R59.9

## 2016-07-03 NOTE — Assessment & Plan Note (Signed)
Advised to call ENT and make sure they see this prior to thyroidectomy which is scheduled in June.

## 2016-07-03 NOTE — Assessment & Plan Note (Signed)
Use Provera if needed for cycle control

## 2016-07-03 NOTE — Patient Instructions (Signed)
Polycystic Ovarian Syndrome °Polycystic ovarian syndrome (PCOS) is a common hormonal disorder among women of reproductive age. In most women with PCOS, many small fluid-filled sacs (cysts) grow on the ovaries, and the cysts are not part of a normal menstrual cycle. PCOS can cause problems with your menstrual periods and make it difficult to get pregnant. It can also cause an increased risk of miscarriage with pregnancy. If it is not treated, PCOS can lead to serious health problems, such as diabetes and heart disease. °What are the causes? °The cause of PCOS is not known, but it may be the result of a combination of certain factors, such as: °· Irregular menstrual cycle. °· High levels of certain hormones (androgens). °· Problems with the hormone that helps to control blood sugar (insulin resistance). °· Certain genes. ° °What increases the risk? °This condition is more likely to develop in women who have a family history of PCOS. °What are the signs or symptoms? °Symptoms of PCOS may include: °· Multiple ovarian cysts. °· Infrequent periods or no periods. °· Periods that are too frequent or too heavy. °· Unpredictable periods. °· Inability to get pregnant (infertility) because of not ovulating. °· Increased growth of hair on the face, chest, stomach, back, thumbs, thighs, or toes. °· Acne or oily skin. Acne may develop during adulthood, and it may not respond to treatment. °· Pelvic pain. °· Weight gain or obesity. °· Patches of thickened and dark brown or black skin on the neck, arms, breasts, or thighs (acanthosis nigricans). °· Excess hair growth on the face, chest, abdomen, or upper thighs (hirsutism). ° °How is this diagnosed? °This condition is diagnosed based on: °· Your medical history. °· A physical exam, including a pelvic exam. Your health care provider may look for areas of increased hair growth on your skin. °· Tests, such as: °? Ultrasound. This may be used to examine the ovaries and the lining of the  uterus (endometrium) for cysts. °? Blood tests. These may be used to check levels of sugar (glucose), female hormone (testosterone), and female hormones (estrogen and progesterone) in your blood. ° °How is this treated? °There is no cure for PCOS, but treatment can help to manage symptoms and prevent more health problems from developing. Treatment varies depending on: °· Your symptoms. °· Whether you want to have a baby or whether you need birth control (contraception). ° °Treatment may include nutrition and lifestyle changes along with: °· Progesterone hormone to start a menstrual period. °· Birth control pills to help you have regular menstrual periods. °· Medicines to make you ovulate, if you want to get pregnant. °· Medicine to reduce excessive hair growth. °· Surgery, in severe cases. This may involve making small holes in one or both of your ovaries. This decreases the amount of testosterone that your body produces. ° °Follow these instructions at home: °· Take over-the-counter and prescription medicines only as told by your health care provider. °· Follow a healthy meal plan. This can help you reduce the effects of PCOS. °? Eat a healthy diet that includes lean proteins, complex carbohydrates, fresh fruits and vegetables, low-fat dairy products, and healthy fats. Make sure to eat enough fiber. °· If you are overweight, lose weight as told by your health care provider. °? Losing 10% of your body weight may improve symptoms. °? Your health care provider can determine how much weight loss is best for you and can help you lose weight safely. °· Keep all follow-up visits as told by   your health care provider. This is important. °Contact a health care provider if: °· Your symptoms do not get better with medicine. °· You develop new symptoms. °This information is not intended to replace advice given to you by your health care provider. Make sure you discuss any questions you have with your health care  provider. °Document Released: 05/12/2004 Document Revised: 09/14/2015 Document Reviewed: 07/04/2015 °Elsevier Interactive Patient Education © 2018 [employer] ° °

## 2016-07-03 NOTE — Progress Notes (Signed)
   Subjective:    Patient ID: Linda Edwards is a 31 y.o. female presenting with Follow-up  on 07/03/2016  HPI: Surgery in June for thyroid. Took provera in April,. Had normal cycle in May. Has a pain in her lymph node which is swollen since Friday.  Review of Systems  Constitutional: Negative for chills and fever.  Respiratory: Negative for shortness of breath.   Cardiovascular: Negative for chest pain.  Gastrointestinal: Negative for abdominal pain, nausea and vomiting.  Genitourinary: Negative for dysuria.  Skin: Negative for rash.      Objective:    BP 123/80   Pulse 92   Ht 5\' 1"  (1.549 m)   Wt 172 lb 6.4 oz (78.2 kg)   LMP 06/26/2016 (Exact Date)   BMI 32.57 kg/m  Physical Exam  Constitutional: She is oriented to person, place, and time. She appears well-developed and well-nourished. No distress.  HENT:  Head: Normocephalic and atraumatic.  Eyes: No scleral icterus.  Neck: Neck supple. Thyromegaly (enlarged on right side) present.  Cardiovascular: Normal rate.   Pulmonary/Chest: Effort normal.  Abdominal: Soft.  Lymphadenopathy:    She has cervical adenopathy.       Right cervical: Superficial cervical (right side 2x2 cm just below mandible) adenopathy present.  Neurological: She is alert and oriented to person, place, and time.  Skin: Skin is warm and dry.  Psychiatric: She has a normal mood and affect.        Assessment & Plan:   Problem List Items Addressed This Visit      Unprioritized   Thyroid nodule   PCOS (polycystic ovarian syndrome) - Primary    Use Provera if needed for cycle control      Swollen lymph nodes    Advised to call ENT and make sure they see this prior to thyroidectomy which is scheduled in June.         Total face-to-face time with patient: 15 minutes. Over 50% of encounter was spent on counseling and coordination of care. Return in about 7 months (around 02/02/2017), or if symptoms worsen or fail to improve, for  annual.   Donnamae Jude 07/03/2016 8:57 AM

## 2016-07-20 ENCOUNTER — Encounter (HOSPITAL_BASED_OUTPATIENT_CLINIC_OR_DEPARTMENT_OTHER): Payer: Self-pay | Admitting: *Deleted

## 2016-07-25 ENCOUNTER — Ambulatory Visit (HOSPITAL_BASED_OUTPATIENT_CLINIC_OR_DEPARTMENT_OTHER): Payer: BLUE CROSS/BLUE SHIELD | Admitting: Anesthesiology

## 2016-07-25 ENCOUNTER — Encounter (HOSPITAL_BASED_OUTPATIENT_CLINIC_OR_DEPARTMENT_OTHER)
Admission: RE | Disposition: A | Discharge status: Home / Self Care | Payer: Self-pay | Source: Ambulatory Visit | Attending: Otolaryngology

## 2016-07-25 ENCOUNTER — Ambulatory Visit (HOSPITAL_BASED_OUTPATIENT_CLINIC_OR_DEPARTMENT_OTHER)
Admission: RE | Admit: 2016-07-25 | Discharge: 2016-07-26 | Disposition: A | Discharge status: Home / Self Care | Payer: BLUE CROSS/BLUE SHIELD | Source: Ambulatory Visit | Attending: Otolaryngology | Admitting: Otolaryngology

## 2016-07-25 ENCOUNTER — Encounter (HOSPITAL_BASED_OUTPATIENT_CLINIC_OR_DEPARTMENT_OTHER): Payer: Self-pay | Admitting: *Deleted

## 2016-07-25 DIAGNOSIS — Z6832 Body mass index (BMI) 32.0-32.9, adult: Secondary | ICD-10-CM | POA: Diagnosis not present

## 2016-07-25 DIAGNOSIS — E042 Nontoxic multinodular goiter: Secondary | ICD-10-CM | POA: Diagnosis present

## 2016-07-25 DIAGNOSIS — E041 Nontoxic single thyroid nodule: Secondary | ICD-10-CM | POA: Diagnosis not present

## 2016-07-25 DIAGNOSIS — C73 Malignant neoplasm of thyroid gland: Secondary | ICD-10-CM | POA: Insufficient documentation

## 2016-07-25 DIAGNOSIS — Z87891 Personal history of nicotine dependence: Secondary | ICD-10-CM | POA: Diagnosis not present

## 2016-07-25 DIAGNOSIS — E063 Autoimmune thyroiditis: Secondary | ICD-10-CM | POA: Insufficient documentation

## 2016-07-25 DIAGNOSIS — D440 Neoplasm of uncertain behavior of thyroid gland: Secondary | ICD-10-CM | POA: Diagnosis not present

## 2016-07-25 DIAGNOSIS — E890 Postprocedural hypothyroidism: Secondary | ICD-10-CM

## 2016-07-25 DIAGNOSIS — E669 Obesity, unspecified: Secondary | ICD-10-CM | POA: Insufficient documentation

## 2016-07-25 DIAGNOSIS — Z9889 Other specified postprocedural states: Secondary | ICD-10-CM

## 2016-07-25 DIAGNOSIS — R1310 Dysphagia, unspecified: Secondary | ICD-10-CM | POA: Diagnosis not present

## 2016-07-25 HISTORY — DX: Nontoxic single thyroid nodule: E04.1

## 2016-07-25 HISTORY — DX: Polycystic ovarian syndrome: E28.2

## 2016-07-25 HISTORY — PX: THYROIDECTOMY: SHX17

## 2016-07-25 SURGERY — THYROIDECTOMY
Anesthesia: General | Site: Neck | Laterality: Right

## 2016-07-25 MED ORDER — SUCCINYLCHOLINE CHLORIDE 200 MG/10ML IV SOSY
PREFILLED_SYRINGE | INTRAVENOUS | Status: AC
  Filled 2016-07-25: qty 10

## 2016-07-25 MED ORDER — ACETAMINOPHEN 500 MG PO TABS
ORAL_TABLET | ORAL | Status: AC
  Filled 2016-07-25: qty 2

## 2016-07-25 MED ORDER — SCOPOLAMINE 1 MG/3DAYS TD PT72: 1.0000 | MEDICATED_PATCH | Freq: Once | TRANSDERMAL | Status: DC | PRN

## 2016-07-25 MED ORDER — KCL IN DEXTROSE-NACL 20-5-0.45 MEQ/L-%-% IV SOLN
INTRAVENOUS | Status: DC
  Administered 2016-07-25: 13:00:00 via INTRAVENOUS
  Filled 2016-07-25 (×2): qty 1000

## 2016-07-25 MED ORDER — CEFAZOLIN SODIUM 1 G IJ SOLR
INTRAMUSCULAR | Status: AC
  Filled 2016-07-25: qty 10

## 2016-07-25 MED ORDER — CEFAZOLIN SODIUM 1 G IJ SOLR
INTRAMUSCULAR | Status: DC | PRN
  Administered 2016-07-25: 2 g via INTRAMUSCULAR

## 2016-07-25 MED ORDER — OXYCODONE-ACETAMINOPHEN 5-325 MG PO TABS: 1.0000 | ORAL_TABLET | ORAL | 0 refills | Status: DC | PRN

## 2016-07-25 MED ORDER — MIDAZOLAM HCL 2 MG/2ML IJ SOLN
INTRAMUSCULAR | Status: AC
  Filled 2016-07-25: qty 2

## 2016-07-25 MED ORDER — LIDOCAINE 2% (20 MG/ML) 5 ML SYRINGE
INTRAMUSCULAR | Status: AC
  Filled 2016-07-25: qty 5

## 2016-07-25 MED ORDER — DEXAMETHASONE SODIUM PHOSPHATE 4 MG/ML IJ SOLN
INTRAMUSCULAR | Status: DC | PRN
  Administered 2016-07-25: 10 mg via INTRAVENOUS

## 2016-07-25 MED ORDER — FENTANYL CITRATE (PF) 100 MCG/2ML IJ SOLN
INTRAMUSCULAR | Status: AC
  Filled 2016-07-25: qty 2

## 2016-07-25 MED ORDER — LIDOCAINE-EPINEPHRINE 1 %-1:100000 IJ SOLN
INTRAMUSCULAR | Status: AC
  Filled 2016-07-25: qty 1

## 2016-07-25 MED ORDER — SUCCINYLCHOLINE CHLORIDE 20 MG/ML IJ SOLN
INTRAMUSCULAR | Status: DC | PRN
  Administered 2016-07-25: 100 mg via INTRAVENOUS

## 2016-07-25 MED ORDER — AMOXICILLIN 875 MG PO TABS: 875.0000 mg | ORAL_TABLET | Freq: Two times a day (BID) | ORAL | 0 refills | Status: AC

## 2016-07-25 MED ORDER — LIDOCAINE 2% (20 MG/ML) 5 ML SYRINGE: INTRAMUSCULAR | Status: DC | PRN

## 2016-07-25 MED ORDER — MORPHINE SULFATE (PF) 2 MG/ML IV SOLN
2.0000 mg | INTRAVENOUS | Status: DC | PRN
  Administered 2016-07-25: 2 mg via INTRAVENOUS

## 2016-07-25 MED ORDER — ACETAMINOPHEN 650 MG RE SUPP: 650.0000 mg | RECTAL | Status: DC | PRN

## 2016-07-25 MED ORDER — ONDANSETRON HCL 4 MG/2ML IJ SOLN
INTRAMUSCULAR | Status: AC
  Filled 2016-07-25: qty 2

## 2016-07-25 MED ORDER — ACETAMINOPHEN 160 MG/5ML PO SOLN: 650.0000 mg | ORAL | Status: DC | PRN

## 2016-07-25 MED ORDER — FENTANYL CITRATE (PF) 100 MCG/2ML IJ SOLN
25.0000 ug | INTRAMUSCULAR | Status: DC | PRN
  Administered 2016-07-25: 25 ug via INTRAVENOUS
  Administered 2016-07-25: 50 ug via INTRAVENOUS
  Administered 2016-07-25: 25 ug via INTRAVENOUS

## 2016-07-25 MED ORDER — LIDOCAINE-EPINEPHRINE 1 %-1:100000 IJ SOLN
INTRAMUSCULAR | Status: DC | PRN
  Administered 2016-07-25: 3.5 mL

## 2016-07-25 MED ORDER — PROMETHAZINE HCL 25 MG/ML IJ SOLN: 6.2500 mg | INTRAMUSCULAR | Status: DC | PRN

## 2016-07-25 MED ORDER — MIDAZOLAM HCL 2 MG/2ML IJ SOLN
1.0000 mg | INTRAMUSCULAR | Status: DC | PRN
  Administered 2016-07-25: 2 mg via INTRAVENOUS

## 2016-07-25 MED ORDER — DEXAMETHASONE SODIUM PHOSPHATE 10 MG/ML IJ SOLN
INTRAMUSCULAR | Status: AC
  Filled 2016-07-25: qty 1

## 2016-07-25 MED ORDER — PROPOFOL 10 MG/ML IV BOLUS
INTRAVENOUS | Status: DC | PRN
  Administered 2016-07-25: 200 mg via INTRAVENOUS

## 2016-07-25 MED ORDER — LIDOCAINE HCL (CARDIAC) 20 MG/ML IV SOLN
INTRAVENOUS | Status: DC | PRN
  Administered 2016-07-25: 60 mg via INTRAVENOUS

## 2016-07-25 MED ORDER — ACETAMINOPHEN 500 MG PO TABS
1000.0000 mg | ORAL_TABLET | Freq: Once | ORAL | Status: AC
  Administered 2016-07-25: 1000 mg via ORAL

## 2016-07-25 MED ORDER — LACTATED RINGERS IV SOLN
INTRAVENOUS | Status: DC
  Administered 2016-07-25: 08:00:00 via INTRAVENOUS

## 2016-07-25 MED ORDER — ONDANSETRON HCL 4 MG/2ML IJ SOLN
INTRAMUSCULAR | Status: DC | PRN
  Administered 2016-07-25: 4 mg via INTRAVENOUS

## 2016-07-25 MED ORDER — FENTANYL CITRATE (PF) 100 MCG/2ML IJ SOLN
50.0000 ug | INTRAMUSCULAR | Status: DC | PRN
  Administered 2016-07-25: 100 ug via INTRAVENOUS

## 2016-07-25 MED ORDER — OXYCODONE-ACETAMINOPHEN 5-325 MG PO TABS
1.0000 | ORAL_TABLET | ORAL | Status: DC | PRN
  Administered 2016-07-25 – 2016-07-26 (×3): 1 via ORAL
  Filled 2016-07-25 (×4): qty 1

## 2016-07-25 MED ORDER — KETOROLAC TROMETHAMINE 30 MG/ML IJ SOLN
INTRAMUSCULAR | Status: AC
  Filled 2016-07-25: qty 1

## 2016-07-25 MED ORDER — MORPHINE SULFATE (PF) 4 MG/ML IV SOLN
INTRAVENOUS | Status: AC
  Filled 2016-07-25: qty 1

## 2016-07-25 SURGICAL SUPPLY — 65 items
ATTRACTOMAT 16X20 MAGNETIC DRP (DRAPES) IMPLANT
BLADE CLIPPER SURG (BLADE) IMPLANT
BLADE SURG 10 STRL SS (BLADE) IMPLANT
BLADE SURG 15 STRL LF DISP TIS (BLADE) ×1 IMPLANT
BLADE SURG 15 STRL SS (BLADE) ×2
CANISTER SUCT 1200ML W/VALVE (MISCELLANEOUS) ×3 IMPLANT
CLIP TI WIDE RED SMALL 6 (CLIP) IMPLANT
CORDS BIPOLAR (ELECTRODE) ×3 IMPLANT
COVER BACK TABLE 60X90IN (DRAPES) ×3 IMPLANT
COVER MAYO STAND STRL (DRAPES) ×3 IMPLANT
DECANTER SPIKE VIAL GLASS SM (MISCELLANEOUS) IMPLANT
DERMABOND ADVANCED (GAUZE/BANDAGES/DRESSINGS) ×2
DERMABOND ADVANCED .7 DNX12 (GAUZE/BANDAGES/DRESSINGS) ×1 IMPLANT
DRAIN CHANNEL 10F 3/8 F FF (DRAIN) ×3 IMPLANT
DRAIN CHANNEL 7F FF FLAT (WOUND CARE) IMPLANT
DRAPE U-SHAPE 76X120 STRL (DRAPES) ×3 IMPLANT
ELECT COATED BLADE 2.86 ST (ELECTRODE) ×3 IMPLANT
ELECT REM PT RETURN 9FT ADLT (ELECTROSURGICAL) ×3
ELECTRODE REM PT RTRN 9FT ADLT (ELECTROSURGICAL) ×1 IMPLANT
EVACUATOR SILICONE 100CC (DRAIN) ×3 IMPLANT
FORCEPS BIPOLAR SPETZLER 8 1.0 (NEUROSURGERY SUPPLIES) ×3 IMPLANT
GAUZE SPONGE 4X4 12PLY STRL LF (GAUZE/BANDAGES/DRESSINGS) IMPLANT
GAUZE SPONGE 4X4 16PLY XRAY LF (GAUZE/BANDAGES/DRESSINGS) ×9 IMPLANT
GLOVE BIO SURGEON STRL SZ 6.5 (GLOVE) IMPLANT
GLOVE BIO SURGEON STRL SZ7.5 (GLOVE) ×3 IMPLANT
GLOVE BIO SURGEONS STRL SZ 6.5 (GLOVE)
GLOVE BIOGEL PI IND STRL 7.0 (GLOVE) ×3 IMPLANT
GLOVE BIOGEL PI INDICATOR 7.0 (GLOVE) ×6
GLOVE ECLIPSE 6.5 STRL STRAW (GLOVE) ×3 IMPLANT
GLOVE EXAM NITRILE EXT CUFF MD (GLOVE) ×3 IMPLANT
GLOVE SURG SS PI 7.0 STRL IVOR (GLOVE) ×3 IMPLANT
GLOVE SURG SYN 7.5  E (GLOVE) ×2
GLOVE SURG SYN 7.5 E (GLOVE) ×1 IMPLANT
GOWN STRL REUS W/ TWL LRG LVL3 (GOWN DISPOSABLE) ×4 IMPLANT
GOWN STRL REUS W/TWL LRG LVL3 (GOWN DISPOSABLE) ×8
HEMOSTAT SURGICEL 2X14 (HEMOSTASIS) IMPLANT
NEEDLE HYPO 25X1 1.5 SAFETY (NEEDLE) ×3 IMPLANT
NS IRRIG 1000ML POUR BTL (IV SOLUTION) ×3 IMPLANT
PACK BASIN DAY SURGERY FS (CUSTOM PROCEDURE TRAY) ×3 IMPLANT
PENCIL BUTTON HOLSTER BLD 10FT (ELECTRODE) ×3 IMPLANT
PIN SAFETY STERILE (MISCELLANEOUS) ×3 IMPLANT
PROBE NERVBE PRASS .33 (MISCELLANEOUS) ×3 IMPLANT
SHEARS HARMONIC 9CM CVD (BLADE) ×3 IMPLANT
SLEEVE SCD COMPRESS KNEE MED (MISCELLANEOUS) ×3 IMPLANT
SPONGE INTESTINAL PEANUT (DISPOSABLE) ×3 IMPLANT
STAPLER VISISTAT 35W (STAPLE) IMPLANT
SUT ETHILON 3 0 PS 1 (SUTURE) ×3 IMPLANT
SUT PROLENE 5 0 P 3 (SUTURE) IMPLANT
SUT SILK 2 0 SH (SUTURE) ×3 IMPLANT
SUT SILK 2 0 TIES 17X18 (SUTURE)
SUT SILK 2-0 18XBRD TIE BLK (SUTURE) IMPLANT
SUT SILK 3 0 TIES 17X18 (SUTURE) ×4
SUT SILK 3-0 18XBRD TIE BLK (SUTURE) ×2 IMPLANT
SUT VIC AB 3-0 FS2 27 (SUTURE) ×3 IMPLANT
SUT VICRYL 4-0 PS2 18IN ABS (SUTURE) ×6 IMPLANT
SYR BULB 3OZ (MISCELLANEOUS) ×3 IMPLANT
SYR CONTROL 10ML LL (SYRINGE) ×3 IMPLANT
TOWEL OR 17X24 6PK STRL BLUE (TOWEL DISPOSABLE) ×6 IMPLANT
TRAY DSU PREP LF (CUSTOM PROCEDURE TRAY) ×3 IMPLANT
TUBE CONNECTING 20'X1/4 (TUBING) ×1
TUBE CONNECTING 20X1/4 (TUBING) ×2 IMPLANT
TUBE ENDOTRAC NIMS EMG 6MM (MISCELLANEOUS) IMPLANT
TUBE ENDOTRAC NIMS EMG 7MM (MISCELLANEOUS) IMPLANT
TUBE ENDOTRAC NIMS EMG 8MM (MISCELLANEOUS) ×3 IMPLANT
TUBE ENDOTRAC NIMS EMG 9MM (MISCELLANEOUS) IMPLANT

## 2016-07-25 NOTE — Op Note (Signed)
DATE OF PROCEDURE:  07/25/2016                              OPERATIVE REPORT  SURGEON:  Leta Baptist, MD  PREOPERATIVE DIAGNOSES: 1. Right thyroid nodules 2. Dysphagia  POSTOPERATIVE DIAGNOSES: 1. Right thyroid nodules 2. Dysphagia  PROCEDURE PERFORMED:  Right total thyroid lobectomy.  ANESTHESIA:  General endotracheal tube anesthesia.  COMPLICATIONS:  None.  ESTIMATED BLOOD LOSS:  63ml  INDICATION FOR PROCEDURE:  Linda Edwards is a 31 y.o. female with a history of 2 enlarged nodules within the right thyroid lobe. The largest one measured 5 cm in diameter. She underwent fine-needle aspiration biopsy of the nodules. The result was consistent with atypia of undetermined significance. The patient has also experienced increasing compressive symptoms from her thyroid nodules. She has noted dysphagia with food getting caught in her throat. Based on the above findings, the decision was made for the patient to undergo the above-stated procedure. Likelihood of success in reducing symptoms was also discussed.  The risks, benefits, alternatives, and details of the procedure were discussed with the patient.  Questions were invited and answered.  Informed consent was obtained.  DESCRIPTION:  The patient was taken to the operating room and placed supine on the operating table.  General endotracheal tube anesthesia was administered by the anesthesiologist. A nerve monitoring endotracheal tube was used. The nerve monitoring system was functional throughout the case. The patient was positioned and prepped and draped in a standard fashion for thyroidectomy surgery.  1% lidocaine with 1-100,000 epinephrine was infiltrated at the planned site of incision. A transverse lower neck incision was made in the standard fashion. The incision was carried down to the level of the platysma muscles. Superiorly and inferiorly based subplatysmal flaps were elevated in a standard fashion. The strap muscles were divided at midline  and retracted laterally, exposing the thyroid gland. The patient's of right thyroid lobe was noted to be severely enlarged, due to a 5 cm right thyroid nodule.  The right thyroid lobe was carefully dissected free from the surrounding soft tissue. The right recurrent laryngeal nerve and the external branch of the right superior laryngeal nerve were both identified. Both nerves were noted to be functional throughout the case. The entire right thyroid lobe and half of the isthmus were resected free. They were sent to the pathology department for permanent histologic identification. The surgical site was copiously irrigated. A #10 JP drain was placed. The incision was closed in layers with 4-0 Vicryl and Dermabond.  The care of the patient was turned over to the anesthesiologist.  The patient was awakened from anesthesia without difficulty.  The patient was extubated and transferred to the recovery room in good condition.  OPERATIVE FINDINGS: Enlarged right thyroid nodules.  SPECIMEN: Right hemithyroidectomy specimen.  FOLLOWUP CARE:  The patient will be observed overnight. The patient will follow up in my office in approximately 1 week.  Eriq Hufford W [name] 07/25/2016 11:23 AM

## 2016-07-25 NOTE — Discharge Instructions (Addendum)
Thyroidectomy, Care After Refer to this sheet in the next few weeks. These instructions provide you with information about caring for yourself after your procedure. Your health care provider may also give you more specific instructions. Your treatment has been planned according to current medical practices, but problems sometimes occur. Call your health care provider if you have any problems or questions after your procedure. What can I expect after the procedure? After your procedure, it is typical to have:  Mild pain in the neck or upper body, especially when swallowing.  A sore throat.  A weak voice.  Follow these instructions at home:  Take medicines only as directed by your health care provider.  Do not take medicines that contain aspirin and ibuprofen until your health care provider says that you can. These medicines can increase your risk of bleeding.  Some pain medicines cause constipation. Drink enough fluid to keep your urine clear or pale yellow. This can help to prevent constipation.  Start slowly with eating. You may need to have only liquids and soft foods for a few days or as directed by your health care provider.  Resume your usual activities as directed by your health care provider.  For the first 10 days after the procedure or as instructed by your health care provider: ? Do not lift anything heavier than 20 lb (9.1 kg). ? Do not jog, swim, or do other strenuous exercises. ? Do not play contact sports.  Keep all follow-up visits as directed by your health care provider. This is important. Contact a health care provider if:  The soreness in your throat gets worse.  You have increased pain at your incision or incisions.  You have increased bleeding from an incision.  Your incision becomes infected. Watch for: ? Swelling. ? Redness. ? Warmth. ? Pus.  You notice a bad smell coming from an incision or dressing.  You have a fever.  You feel lightheaded or  faint.  You have numbness, tingling, or muscle spasms in your: ? Arms. ? Hands. ? Feet. ? Face.  You have trouble swallowing. Get help right away if:  You develop a rash.  You have difficulty breathing.  You hear whistling noises coming from your chest.  You develop a cough that gets worse.  Your speech changes, or you have hoarseness that gets worse. This information is not intended to replace advice given to you by your health care provider. Make sure you discuss any questions you have with your health care provider. Document Released: 08/05/2004 Document Revised: 09/19/2015 Document Reviewed: 06/18/2013 Elsevier Interactive Patient Education  2018 [facility] Instructions  Activity: Get plenty of rest for the remainder of the day. A responsible individual must stay with you for 24 hours following the procedure.  For the next 24 hours, DO NOT: -Drive a car -Paediatric nurse -Drink alcoholic beverages -Take any medication unless instructed by your physician -Make any legal decisions or sign important papers.  Meals: Start with liquid foods such as gelatin or soup. Progress to regular foods as tolerated. Avoid greasy, spicy, heavy foods. If nausea and/or vomiting occur, drink only clear liquids until the nausea and/or vomiting subsides. Call your physician if vomiting continues.  Special Instructions/Symptoms: Your throat may feel dry or sore from the anesthesia or the breathing tube placed in your throat during surgery. If this causes discomfort, gargle with warm salt water. The discomfort should disappear within 24 hours.  If you had a scopolamine  patch placed behind your ear for the management of post- operative nausea and/or vomiting:  1. The medication in the patch is effective for 72 hours, after which it should be removed.  Wrap patch in a tissue and discard in the trash. Wash hands thoroughly with soap and water. 2. You may remove  the patch earlier than 72 hours if you experience unpleasant side effects which may include dry mouth, dizziness or visual disturbances. 3. Avoid touching the patch. Wash your hands with soap and water after contact with the patch.

## 2016-07-25 NOTE — H&P (Signed)
Cc: Enlarged thyroid nodules  HPI: The patient is a 31 year old female who returns today for her follow-up evaluation.  The patient has a history of 2 enlarged nodules within the right thyroid lobe. The largest one measured 5 cm in diameter.  She underwent fine needle aspiration biopsy of the nodules.  The result was consistent with atypia of undetermined significance.  At her last visit, the option of hemithyroidectomy surgery was discussed.  The patient returns today complaining of increasing compressive symptoms from her thyroid nodules.  She has noted occasional dysphagia with foods getting caught in her throat.  She is interested in proceeding with the hemithyroidectomy surgery.  Currently she denies any dysphonia or odynophagia.    Exam: General: Communicates without difficulty, well nourished, no acute distress. Head: Normocephalic, no evidence injury, no tenderness, facial buttresses intact without stepoff. Face/sinus: No tenderness to palpation and percussion. Facial movement is normal and symmetric. Eyes: PERRL, EOMI. No scleral icterus, conjunctivae clear. Neuro: CN II exam reveals vision grossly intact.  No nystagmus at any point of gaze. Ears: Auricles well formed without lesions.  Ear canals are intact without mass or lesion.  No erythema or edema is appreciated.  The TMs are intact without fluid. Nose: External evaluation reveals normal support and skin without lesions.  Dorsum is intact.  Anterior rhinoscopy reveals healthy pink mucosa over anterior aspect of inferior turbinates and intact septum.  No purulence noted. Oral:  Oral cavity and oropharynx are intact, symmetric, without erythema or edema.  Mucosa is moist without lesions. Neck: Full range of motion without pain.  There is no significant lymphadenopathy.  No masses palpable.  Thyroid bed is enlarged.  Parotid glands and submandibular glands equal bilaterally without mass.  Trachea is midline. Neuro:  CN 2-12 grossly intact. Gait  normal.   Assessment: 1.  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  2.  The patient has developed increasing compressive symptoms from her large thyroid nodules.   3.  The patient has a normal laryngoscopy examination today.  Her vocal cords are noted to be mobile bilaterally.   Plan: 1.  The laryngoscopy findings are reviewed with the patient.  2.  The risks, benefits, alternatives and details of the right hemithyroidectomy surgery are discussed with the patient. Questions are invited and answered.  3.  The patient would like to proceed with the procedure.

## 2016-07-25 NOTE — Anesthesia Preprocedure Evaluation (Signed)
Anesthesia Evaluation  Patient identified by MRN, date of birth, ID band Patient awake    Reviewed: Allergy & Precautions, NPO status , Patient's Chart, lab work & pertinent test results  Airway Mallampati: II  TM Distance: >3 FB Neck ROM: Full    Dental  (+) Teeth Intact, Dental Advisory Given   Pulmonary former smoker,    Pulmonary exam normal breath sounds clear to auscultation       Cardiovascular Exercise Tolerance: Good negative cardio ROS Normal cardiovascular exam Rhythm:Regular Rate:Normal     Neuro/Psych negative neurological ROS     GI/Hepatic negative GI ROS, Neg liver ROS,   Endo/Other  Right thyroid nodule Obesity   Renal/GU negative Renal ROS     Musculoskeletal negative musculoskeletal ROS (+)   Abdominal   Peds  Hematology negative hematology ROS (+)   Anesthesia Other Findings Day of surgery medications reviewed with the patient.  Reproductive/Obstetrics negative OB ROS                             Anesthesia Physical Anesthesia Plan  ASA: II  Anesthesia Plan: General   Post-op Pain Management:    Induction: Intravenous  PONV Risk Score and Plan: 4 or greater and Ondansetron, Dexamethasone, Propofol, Midazolam and Scopolamine patch - Pre-op  Airway Management Planned: Oral ETT  Additional Equipment:   Intra-op Plan:   Post-operative Plan: Extubation in OR  Informed Consent: I have reviewed the patients History and Physical, chart, labs and discussed the procedure including the risks, benefits and alternatives for the proposed anesthesia with the patient or authorized representative who has indicated his/her understanding and acceptance.   Dental advisory given  Plan Discussed with: CRNA  Anesthesia Plan Comments: (Risks/benefits of general anesthesia discussed with patient including risk of damage to teeth, lips, gum, and tongue, nausea/vomiting,  allergic reactions to medications, and the possibility of heart attack, stroke and death.  All patient questions answered.  Patient wishes to proceed.)        Anesthesia Quick Evaluation

## 2016-07-25 NOTE — Anesthesia Procedure Notes (Signed)
Procedure Name: Intubation Performed by: Terrance Mass Pre-anesthesia Checklist: Patient identified, Emergency Drugs available, Suction available and Patient being monitored Patient Re-evaluated:Patient Re-evaluated prior to inductionOxygen Delivery Method: Circle system utilized Preoxygenation: Pre-oxygenation with 100% oxygen Intubation Type: IV induction Ventilation: Mask ventilation without difficulty Laryngoscope Size: Miller and 2 Grade View: Grade III Tube type: Oral Tube size: 8.0 mm Number of attempts: 1 Airway Equipment and Method: Stylet Placement Confirmation: ETT inserted through vocal cords under direct vision,  positive ETCO2 and breath sounds checked- equal and bilateral Secured at: 22 cm Tube secured with: Tape Dental Injury: Teeth and Oropharynx as per pre-operative assessment

## 2016-07-25 NOTE — Transfer of Care (Signed)
Immediate Anesthesia Transfer of Care Note  Patient: Linda Edwards  Procedure(s) Performed: Procedure(s): RIGHT HEMI THYROIDECTOMY (Right)  Patient Location: PACU  Anesthesia Type:General  Level of Consciousness: awake and sedated  Airway & Oxygen Therapy: Patient Spontanous Breathing and Patient connected to face mask oxygen  Post-op Assessment: Report given to RN and Post -op Vital signs reviewed and stable  Post vital signs: Reviewed and stable  Last Vitals:  Vitals:   07/25/16 0804  BP: 126/75  Pulse: 98  Resp: 17  Temp: 36.9 C    Last Pain:  Vitals:   07/25/16 0804  TempSrc: Oral  PainSc: 0-No pain         Complications: No apparent anesthesia complications

## 2016-07-26 ENCOUNTER — Encounter (HOSPITAL_BASED_OUTPATIENT_CLINIC_OR_DEPARTMENT_OTHER): Payer: Self-pay | Admitting: Otolaryngology

## 2016-07-26 DIAGNOSIS — R1310 Dysphagia, unspecified: Secondary | ICD-10-CM | POA: Diagnosis not present

## 2016-07-26 DIAGNOSIS — Z6832 Body mass index (BMI) 32.0-32.9, adult: Secondary | ICD-10-CM | POA: Diagnosis not present

## 2016-07-26 DIAGNOSIS — C73 Malignant neoplasm of thyroid gland: Secondary | ICD-10-CM | POA: Diagnosis not present

## 2016-07-26 DIAGNOSIS — E063 Autoimmune thyroiditis: Secondary | ICD-10-CM | POA: Diagnosis not present

## 2016-07-26 DIAGNOSIS — E669 Obesity, unspecified: Secondary | ICD-10-CM | POA: Diagnosis not present

## 2016-07-26 DIAGNOSIS — Z87891 Personal history of nicotine dependence: Secondary | ICD-10-CM | POA: Diagnosis not present

## 2016-07-26 NOTE — Anesthesia Postprocedure Evaluation (Signed)
Anesthesia Post Note  Patient: Eller Sweis  Procedure(s) Performed: Procedure(s) (LRB): RIGHT HEMI THYROIDECTOMY (Right)     Patient location during evaluation: PACU Anesthesia Type: General Level of consciousness: awake and alert Pain management: pain level controlled Vital Signs Assessment: post-procedure vital signs reviewed and stable Respiratory status: spontaneous breathing, nonlabored ventilation, respiratory function stable and patient connected to nasal cannula oxygen Cardiovascular status: blood pressure returned to baseline and stable Postop Assessment: no signs of nausea or vomiting Anesthetic complications: no    Last Vitals:  Vitals:   07/26/16 0530 07/26/16 0620  BP:  (!) 104/59  Pulse: 71 81  Resp:  16  Temp:  36.4 C    Last Pain:  Vitals:   07/26/16 0620  TempSrc:   PainSc: 4                  Catalina Gravel

## 2016-07-26 NOTE — Discharge Summary (Signed)
Physician Discharge Summary  Patient ID: Linda Edwards MRN: [id number] DOB/AGE: 1985/07/03 31 y.o.  Admit date: 07/25/2016 Discharge date: 07/26/2016  Admission Diagnoses: Right thyroid nodules  Discharge Diagnoses: Right thyroid nodules Active Problems:   S/P partial thyroidectomy   Discharged Condition: good  Hospital Course: Pt had an uneventful overnight stay. Pt tolerated po well. No bleeding. No stridor. Voice is good.  Consults: None  Significant Diagnostic Studies: None  Treatments: surgery: Right total thyroid lobectomy.  Discharge Exam: Blood pressure (!) 104/59, pulse 81, temperature 97.5 F (36.4 C), resp. rate 16, height 5\' 1"  (1.549 m), weight 171 lb 9.6 oz (77.8 kg), last menstrual period 07/01/2016, SpO2 97 %. Incision/Wound:c/d/i  Disposition: 07-Left Against Medical Advice/Left Without Being Seen/Elopement  Discharge Instructions    Activity as tolerated - No restrictions    Complete by:  As directed    Diet general    Complete by:  As directed      Allergies as of 07/26/2016   No Known Allergies     Medication List    TAKE these medications   amoxicillin 875 MG tablet Commonly known as:  AMOXIL Take 1 tablet (875 mg total) by mouth 2 (two) times daily.   medroxyPROGESTERone 10 MG tablet Commonly known as:  PROVERA Take 1 tablet (10 mg total) by mouth daily.   oxyCODONE-acetaminophen 5-325 MG tablet Commonly known as:  ROXICET Take 1 tablet by mouth every 4 (four) hours as needed for severe pain.      Follow-up Information    Leta Baptist, MD Follow up in 1 week(s).   Specialty:  Otolaryngology Why:  As scheduled Contact information: [address] [place] [address] [phone]           Signed: Burley Saver 07/26/2016, 6:49 AM

## 2016-07-27 ENCOUNTER — Encounter (HOSPITAL_COMMUNITY): Payer: Self-pay | Admitting: *Deleted

## 2016-07-27 ENCOUNTER — Other Ambulatory Visit: Payer: Self-pay | Admitting: Otolaryngology

## 2016-07-28 ENCOUNTER — Ambulatory Visit (HOSPITAL_COMMUNITY): Payer: BLUE CROSS/BLUE SHIELD

## 2016-07-28 ENCOUNTER — Ambulatory Visit (HOSPITAL_COMMUNITY): Payer: BLUE CROSS/BLUE SHIELD | Admitting: Anesthesiology

## 2016-07-28 ENCOUNTER — Ambulatory Visit (HOSPITAL_COMMUNITY)
Admission: RE | Admit: 2016-07-28 | Discharge: 2016-07-29 | Disposition: A | Discharge status: Home / Self Care | Payer: BLUE CROSS/BLUE SHIELD | Source: Ambulatory Visit | Attending: Otolaryngology | Admitting: Otolaryngology

## 2016-07-28 ENCOUNTER — Encounter (HOSPITAL_COMMUNITY)
Admission: RE | Disposition: A | Discharge status: Home / Self Care | Payer: Self-pay | Source: Ambulatory Visit | Attending: Otolaryngology

## 2016-07-28 ENCOUNTER — Encounter (HOSPITAL_COMMUNITY): Payer: Self-pay

## 2016-07-28 DIAGNOSIS — E669 Obesity, unspecified: Secondary | ICD-10-CM | POA: Insufficient documentation

## 2016-07-28 DIAGNOSIS — E890 Postprocedural hypothyroidism: Secondary | ICD-10-CM | POA: Diagnosis present

## 2016-07-28 DIAGNOSIS — K5900 Constipation, unspecified: Secondary | ICD-10-CM | POA: Diagnosis not present

## 2016-07-28 DIAGNOSIS — E041 Nontoxic single thyroid nodule: Secondary | ICD-10-CM | POA: Diagnosis not present

## 2016-07-28 DIAGNOSIS — Z87891 Personal history of nicotine dependence: Secondary | ICD-10-CM | POA: Diagnosis not present

## 2016-07-28 DIAGNOSIS — R599 Enlarged lymph nodes, unspecified: Secondary | ICD-10-CM | POA: Diagnosis not present

## 2016-07-28 DIAGNOSIS — Z9889 Other specified postprocedural states: Secondary | ICD-10-CM | POA: Diagnosis present

## 2016-07-28 DIAGNOSIS — E063 Autoimmune thyroiditis: Secondary | ICD-10-CM | POA: Insufficient documentation

## 2016-07-28 DIAGNOSIS — Z01818 Encounter for other preprocedural examination: Secondary | ICD-10-CM

## 2016-07-28 DIAGNOSIS — N911 Secondary amenorrhea: Secondary | ICD-10-CM | POA: Diagnosis not present

## 2016-07-28 DIAGNOSIS — E282 Polycystic ovarian syndrome: Secondary | ICD-10-CM | POA: Diagnosis not present

## 2016-07-28 DIAGNOSIS — R05 Cough: Secondary | ICD-10-CM | POA: Diagnosis not present

## 2016-07-28 DIAGNOSIS — C73 Malignant neoplasm of thyroid gland: Secondary | ICD-10-CM | POA: Insufficient documentation

## 2016-07-28 DIAGNOSIS — Z6832 Body mass index (BMI) 32.0-32.9, adult: Secondary | ICD-10-CM | POA: Insufficient documentation

## 2016-07-28 HISTORY — PX: THYROIDECTOMY: SHX17

## 2016-07-28 HISTORY — PX: THYROID LOBECTOMY: SHX420

## 2016-07-28 HISTORY — DX: Malignant neoplasm of thyroid gland: C73

## 2016-07-28 HISTORY — DX: Hypothyroidism, unspecified: E03.9

## 2016-07-28 LAB — BASIC METABOLIC PANEL
Anion gap: 8 (ref 5–15)
BUN: 6 mg/dL (ref 6–20)
CHLORIDE: 103 mmol/L (ref 101–111)
CO2: 25 mmol/L (ref 22–32)
Calcium: 9.4 mg/dL (ref 8.9–10.3)
Creatinine, Ser: 0.63 mg/dL (ref 0.44–1.00)
GFR calc Af Amer: >60 mL/min (ref >60)
GFR calc non Af Amer: >60 mL/min (ref >60)
GLUCOSE: 86 mg/dL (ref 65–99)
Potassium: 3.8 mmol/L (ref 3.5–5.1)
Sodium: 136 mmol/L (ref 135–145)

## 2016-07-28 LAB — CBC
HEMATOCRIT: 39.2 % (ref 36.0–46.0)
HEMOGLOBIN: 12.7 g/dL (ref 12.0–15.0)
MCH: 27.7 pg (ref 26.0–34.0)
MCHC: 32.4 g/dL (ref 30.0–36.0)
MCV: 85.4 fL (ref 78.0–100.0)
Platelets: 306 10*3/uL (ref 150–400)
RBC: 4.59 MIL/uL (ref 3.87–5.11)
RDW: 13.4 % (ref 11.5–15.5)
WBC: 13 10*3/uL — ABNORMAL HIGH (ref 4.0–10.5)

## 2016-07-28 LAB — HCG, SERUM, QUALITATIVE: Preg, Serum: NEGATIVE

## 2016-07-28 LAB — CALCIUM
Calcium: 9 mg/dL (ref 8.9–10.3)
Calcium: 8.7 mg/dL — ABNORMAL LOW (ref 8.9–10.3)

## 2016-07-28 SURGERY — THYROIDECTOMY
Anesthesia: General | Site: Neck

## 2016-07-28 MED ORDER — LACTATED RINGERS IV SOLN
INTRAVENOUS | Status: DC
  Administered 2016-07-28 (×2): via INTRAVENOUS

## 2016-07-28 MED ORDER — FENTANYL CITRATE (PF) 100 MCG/2ML IJ SOLN
25.0000 ug | INTRAMUSCULAR | Status: DC | PRN
  Administered 2016-07-28: 50 ug via INTRAVENOUS

## 2016-07-28 MED ORDER — SUCCINYLCHOLINE CHLORIDE 200 MG/10ML IV SOSY
PREFILLED_SYRINGE | INTRAVENOUS | Status: DC | PRN
  Administered 2016-07-28: 70 mg via INTRAVENOUS

## 2016-07-28 MED ORDER — PHENYLEPHRINE HCL 10 MG/ML IJ SOLN
INTRAVENOUS | Status: DC | PRN
  Administered 2016-07-28: 75 ug/min via INTRAVENOUS

## 2016-07-28 MED ORDER — FENTANYL CITRATE (PF) 100 MCG/2ML IJ SOLN
INTRAMUSCULAR | Status: DC | PRN
  Administered 2016-07-28: 50 ug via INTRAVENOUS
  Administered 2016-07-28 (×2): 100 ug via INTRAVENOUS

## 2016-07-28 MED ORDER — DEXAMETHASONE SODIUM PHOSPHATE 10 MG/ML IJ SOLN
INTRAMUSCULAR | Status: DC | PRN
  Administered 2016-07-28: 10 mg via INTRAVENOUS

## 2016-07-28 MED ORDER — MIDAZOLAM HCL 5 MG/5ML IJ SOLN
INTRAMUSCULAR | Status: DC | PRN
  Administered 2016-07-28: 2 mg via INTRAVENOUS

## 2016-07-28 MED ORDER — KCL IN DEXTROSE-NACL 20-5-0.45 MEQ/L-%-% IV SOLN
INTRAVENOUS | Status: DC
  Administered 2016-07-28: 17:00:00 via INTRAVENOUS
  Filled 2016-07-28 (×2): qty 1000

## 2016-07-28 MED ORDER — MORPHINE SULFATE (PF) 4 MG/ML IV SOLN: 2.0000 mg | INTRAVENOUS | Status: DC | PRN

## 2016-07-28 MED ORDER — PROPOFOL 10 MG/ML IV BOLUS
INTRAVENOUS | Status: DC | PRN
  Administered 2016-07-28: 160 mg via INTRAVENOUS

## 2016-07-28 MED ORDER — SUCCINYLCHOLINE CHLORIDE 200 MG/10ML IV SOSY
PREFILLED_SYRINGE | INTRAVENOUS | Status: AC
  Filled 2016-07-28: qty 10

## 2016-07-28 MED ORDER — OXYCODONE HCL 5 MG/5ML PO SOLN
5.0000 mg | Freq: Once | ORAL | Status: DC | PRN
Start: 2016-07-28 — End: 2016-07-28

## 2016-07-28 MED ORDER — AMOXICILLIN 875 MG PO TABS: 875.0000 mg | ORAL_TABLET | Freq: Two times a day (BID) | ORAL | Status: DC

## 2016-07-28 MED ORDER — CALCIUM CARBONATE-VITAMIN D 500-200 MG-UNIT PO TABS
2.0000 | ORAL_TABLET | Freq: Two times a day (BID) | ORAL | Status: DC
  Administered 2016-07-28 – 2016-07-29 (×2): 2 via ORAL
  Filled 2016-07-28 (×2): qty 2

## 2016-07-28 MED ORDER — GLYCOPYRROLATE 0.2 MG/ML IJ SOLN
INTRAMUSCULAR | Status: DC | PRN
  Administered 2016-07-28: 0.2 mg via INTRAVENOUS

## 2016-07-28 MED ORDER — PROPOFOL 1000 MG/100ML IV EMUL
INTRAVENOUS | Status: AC
  Filled 2016-07-28: qty 100

## 2016-07-28 MED ORDER — OXYCODONE-ACETAMINOPHEN 5-325 MG PO TABS
1.0000 | ORAL_TABLET | ORAL | Status: DC | PRN
  Administered 2016-07-28 – 2016-07-29 (×4): 1 via ORAL
  Filled 2016-07-28 (×2): qty 2
  Filled 2016-07-28 (×2): qty 1

## 2016-07-28 MED ORDER — FENTANYL CITRATE (PF) 100 MCG/2ML IJ SOLN
INTRAMUSCULAR | Status: AC
  Administered 2016-07-28: 50 ug via INTRAVENOUS
  Filled 2016-07-28: qty 2

## 2016-07-28 MED ORDER — LIDOCAINE-EPINEPHRINE 1 %-1:100000 IJ SOLN
INTRAMUSCULAR | Status: DC | PRN
  Administered 2016-07-28: 5 mL

## 2016-07-28 MED ORDER — ONDANSETRON HCL 4 MG/2ML IJ SOLN
INTRAMUSCULAR | Status: AC
  Filled 2016-07-28: qty 2

## 2016-07-28 MED ORDER — ONDANSETRON HCL 4 MG/2ML IJ SOLN: 4.0000 mg | INTRAMUSCULAR | Status: DC | PRN

## 2016-07-28 MED ORDER — 0.9 % SODIUM CHLORIDE (POUR BTL) OPTIME
TOPICAL | Status: DC | PRN
  Administered 2016-07-28: 1000 mL

## 2016-07-28 MED ORDER — OXYCODONE HCL 5 MG PO TABS: 5.0000 mg | ORAL_TABLET | Freq: Once | ORAL | Status: DC | PRN

## 2016-07-28 MED ORDER — FENTANYL CITRATE (PF) 250 MCG/5ML IJ SOLN
INTRAMUSCULAR | Status: AC
  Filled 2016-07-28: qty 5

## 2016-07-28 MED ORDER — PROPOFOL 500 MG/50ML IV EMUL
INTRAVENOUS | Status: DC | PRN
  Administered 2016-07-28: 50 ug/kg/min via INTRAVENOUS

## 2016-07-28 MED ORDER — AMOXICILLIN 500 MG PO CAPS
500.0000 mg | ORAL_CAPSULE | Freq: Two times a day (BID) | ORAL | Status: DC
  Administered 2016-07-28 – 2016-07-29 (×2): 500 mg via ORAL
  Filled 2016-07-28 (×3): qty 1

## 2016-07-28 MED ORDER — ONDANSETRON HCL 4 MG/2ML IJ SOLN
INTRAMUSCULAR | Status: DC | PRN
  Administered 2016-07-28: 4 mg via INTRAVENOUS

## 2016-07-28 MED ORDER — MIDAZOLAM HCL 2 MG/2ML IJ SOLN
INTRAMUSCULAR | Status: AC
  Filled 2016-07-28: qty 2

## 2016-07-28 MED ORDER — ONDANSETRON HCL 4 MG PO TABS: 4.0000 mg | ORAL_TABLET | ORAL | Status: DC | PRN

## 2016-07-28 MED ORDER — LIDOCAINE-EPINEPHRINE 1 %-1:100000 IJ SOLN
INTRAMUSCULAR | Status: AC
  Filled 2016-07-28: qty 1

## 2016-07-28 MED ORDER — PROPOFOL 10 MG/ML IV BOLUS
INTRAVENOUS | Status: AC
Start: 2016-07-28 — End: ?
  Filled 2016-07-28: qty 20

## 2016-07-28 SURGICAL SUPPLY — 47 items
ATTRACTOMAT 16X20 MAGNETIC DRP (DRAPES) IMPLANT
BLADE CLIPPER SURG (BLADE) IMPLANT
BLADE SURG 15 STRL LF DISP TIS (BLADE) IMPLANT
BLADE SURG 15 STRL SS (BLADE)
CANISTER SUCT 3000ML PPV (MISCELLANEOUS) ×2 IMPLANT
CLEANER TIP ELECTROSURG 2X2 (MISCELLANEOUS) ×2 IMPLANT
CLIP TI WIDE RED SMALL 24 (CLIP) IMPLANT
CONT SPEC 4OZ CLIKSEAL STRL BL (MISCELLANEOUS) IMPLANT
CORDS BIPOLAR (ELECTRODE) ×2 IMPLANT
COVER SURGICAL LIGHT HANDLE (MISCELLANEOUS) ×2 IMPLANT
CRADLE DONUT ADULT HEAD (MISCELLANEOUS) ×2 IMPLANT
DERMABOND ADVANCED (GAUZE/BANDAGES/DRESSINGS) ×1
DERMABOND ADVANCED .7 DNX12 (GAUZE/BANDAGES/DRESSINGS) ×1 IMPLANT
DRAIN CHANNEL 10F 3/8 F FF (DRAIN) ×2 IMPLANT
DRAPE HALF SHEET 40X57 (DRAPES) ×2 IMPLANT
ELECT COATED BLADE 2.86 ST (ELECTRODE) ×2 IMPLANT
ELECT REM PT RETURN 9FT ADLT (ELECTROSURGICAL) ×2
ELECTRODE REM PT RTRN 9FT ADLT (ELECTROSURGICAL) ×1 IMPLANT
EVACUATOR SILICONE 100CC (DRAIN) ×2 IMPLANT
FORCEPS BIPOLAR SPETZLER 8 1.0 (NEUROSURGERY SUPPLIES) ×2 IMPLANT
GAUZE SPONGE 4X4 16PLY XRAY LF (GAUZE/BANDAGES/DRESSINGS) ×2 IMPLANT
GLOVE BIO SURGEON STRL SZ 6.5 (GLOVE) ×2 IMPLANT
GLOVE BIOGEL PI IND STRL 6.5 (GLOVE) ×1 IMPLANT
GLOVE BIOGEL PI IND STRL 7.5 (GLOVE) ×1 IMPLANT
GLOVE BIOGEL PI INDICATOR 6.5 (GLOVE) ×1
GLOVE BIOGEL PI INDICATOR 7.5 (GLOVE) ×1
GLOVE ECLIPSE 7.5 STRL STRAW (GLOVE) ×2 IMPLANT
GOWN STRL REUS W/ TWL LRG LVL3 (GOWN DISPOSABLE) ×3 IMPLANT
GOWN STRL REUS W/TWL LRG LVL3 (GOWN DISPOSABLE) ×3
HEMOSTAT SURGICEL 2X14 (HEMOSTASIS) IMPLANT
KIT BASIN OR (CUSTOM PROCEDURE TRAY) ×2 IMPLANT
KIT ROOM TURNOVER OR (KITS) ×2 IMPLANT
NEEDLE HYPO 25GX1X1/2 BEV (NEEDLE) ×2 IMPLANT
NS IRRIG 1000ML POUR BTL (IV SOLUTION) ×2 IMPLANT
PAD ARMBOARD 7.5X6 YLW CONV (MISCELLANEOUS) ×2 IMPLANT
PENCIL BUTTON HOLSTER BLD 10FT (ELECTRODE) ×2 IMPLANT
PROBE NERVBE PRASS .33 (MISCELLANEOUS) ×2 IMPLANT
SHEARS HARMONIC 9CM CVD (BLADE) ×2 IMPLANT
SPONGE INTESTINAL PEANUT (DISPOSABLE) ×2 IMPLANT
SUT ETHILON 2 0 FS 18 (SUTURE) ×2 IMPLANT
SUT SILK 2 0 FS (SUTURE) ×2 IMPLANT
SUT SILK 3 0 REEL (SUTURE) ×2 IMPLANT
SUT VIC AB 4-0 PS2 18 (SUTURE) ×2 IMPLANT
SUT VICRYL 4-0 PS2 18IN ABS (SUTURE) ×2 IMPLANT
TRAY ENT MC OR (CUSTOM PROCEDURE TRAY) ×2 IMPLANT
TRAY FOLEY W/METER SILVER 14FR (SET/KITS/TRAYS/PACK) IMPLANT
TUBE ENDOTRAC EMG 7X10.2 (MISCELLANEOUS) ×2 IMPLANT

## 2016-07-28 NOTE — Op Note (Signed)
DATE OF PROCEDURE:  07/28/2016                              OPERATIVE REPORT  SURGEON:  Leta Baptist, MD  PREOPERATIVE DIAGNOSES: 1. Thyroid carcinoma  POSTOPERATIVE DIAGNOSES: 1. Thyroid carcinoma  PROCEDURE PERFORMED:  Completion thyroidectomy (CPT [phone])  ANESTHESIA:  General endotracheal tube anesthesia.  COMPLICATIONS:  None.  ESTIMATED BLOOD LOSS:  100 ml.  INDICATION FOR PROCEDURE:  Linda Edwards is a 31 y.o. female with a history of 2 enlarged nodules within the right thyroid lobe. She underwent right hemithyroidectomy surgery 3 days ago. The patient was noted to have 2 foci of carcinoma. 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 Based on the above findings, the decision was made for the patient to undergo removal of the residual left thyroid lobe.  The risks, benefits, alternatives, and details of the procedure were discussed with the patient.  Questions were invited and answered.  Informed consent was obtained.  DESCRIPTION:  The patient was taken to the operating room and placed supine on the operating table.  General endotracheal tube anesthesia was administered by the anesthesiologist. A nerve monitoring endotracheal tube was used. The nerve monitoring system was functional throughout the case. The patient was positioned and prepped and draped in a standard fashion for thyroidectomy surgery.  1% lidocaine with 1-100,000 epinephrine was infiltrated at the planned site of incision. A transverse lower neck incision was made via the previous incision. The incision was carried down to the level of the platysma muscles. Superiorly and inferiorly based subplatysmal flaps were elevated in a standard fashion. The strap muscles were divided at midline and retracted laterally, exposing the residual left thyroid lobe.   The left thyroid lobe was carefully dissected free from the surrounding soft tissue. The left recurrent laryngeal nerve and the  external branch of the left superior laryngeal nerve were both identified. Both nerves were noted to be functional throughout the case. The entire left thyroid lobe was resected free. They were sent to the pathology department for permanent histologic identification. The surgical site was copiously irrigated. A #10 JP drain was placed. The incision was closed in layers with 4-0 Vicryl and Dermabond.  The care of the patient was turned over to the anesthesiologist.  The patient was awakened from anesthesia without difficulty.  The patient was extubated and transferred to the recovery room in good condition.  OPERATIVE FINDINGS:  Residual left thyroid lobe.  SPECIMEN:  Left thyroid lobe.  FOLLOWUP CARE:  The patient will be admitted for observation.  [name] W Dvon Jiles 07/28/2016 2:22 PM

## 2016-07-28 NOTE — Anesthesia Procedure Notes (Addendum)
Procedure Name: Intubation Date/Time: 07/28/2016 12:36 PM Performed by: Freddie Breech Pre-anesthesia Checklist: Patient identified, Emergency Drugs available, Suction available, Patient being monitored and Timeout performed Patient Re-evaluated:Patient Re-evaluated prior to inductionOxygen Delivery Method: Circle System Utilized Preoxygenation: Pre-oxygenation with 100% oxygen Intubation Type: IV induction Ventilation: Mask ventilation without difficulty Laryngoscope Size: Glidescope and 3 Grade View: Grade I Tube type: Oral (NIM tube 7.0.  Position confirmed by Dr. Ermalene Postin via videoscope.  ) Tube size: 7.0 mm Number of attempts: 1 Airway Equipment and Method: Stylet,  Oral airway,  Video-laryngoscopy and Bite block Placement Confirmation: ETT inserted through vocal cords under direct vision,  positive ETCO2 and breath sounds checked- equal and bilateral Secured at: 23 cm Tube secured with: Tape Dental Injury: Teeth and Oropharynx as per pre-operative assessment  Comments: Placed by Guerry Bruin

## 2016-07-28 NOTE — Anesthesia Preprocedure Evaluation (Signed)
Anesthesia Evaluation  Patient identified by MRN, date of birth, ID band Patient awake    Reviewed: Allergy & Precautions, NPO status , Patient's Chart, lab work & pertinent test results  History of Anesthesia Complications Negative for: history of anesthetic complications  Airway Mallampati: II  TM Distance: >3 FB Neck ROM: Full    Dental  (+) Teeth Intact, Dental Advisory Given   Pulmonary neg shortness of breath, neg sleep apnea, neg COPD, neg recent URI, former smoker,    Pulmonary exam normal breath sounds clear to auscultation       Cardiovascular Exercise Tolerance: Good negative cardio ROS Normal cardiovascular exam Rhythm:Regular Rate:Normal     Neuro/Psych negative neurological ROS     GI/Hepatic negative GI ROS, Neg liver ROS,   Endo/Other  Right thyroid nodule Obesity   Renal/GU negative Renal ROS     Musculoskeletal negative musculoskeletal ROS (+)   Abdominal   Peds  Hematology negative hematology ROS (+)   Anesthesia Other Findings Day of surgery medications reviewed with the patient.  Reproductive/Obstetrics negative OB ROS                             Anesthesia Physical Anesthesia Plan  ASA: II  Anesthesia Plan: General   Post-op Pain Management:    Induction: Intravenous  PONV Risk Score and Plan: 3 and Ondansetron, Dexamethasone, Propofol and Midazolam  Airway Management Planned: Oral ETT  Additional Equipment: None  Intra-op Plan:   Post-operative Plan: Extubation in OR  Informed Consent: I have reviewed the patients History and Physical, chart, labs and discussed the procedure including the risks, benefits and alternatives for the proposed anesthesia with the patient or authorized representative who has indicated his/her understanding and acceptance.   Dental advisory given  Plan Discussed with: CRNA and Surgeon  Anesthesia Plan Comments:          Anesthesia Quick Evaluation

## 2016-07-28 NOTE — H&P (Signed)
Cc: Thyroid carcinoma  HPI: The patient is a 31 year old female who returns today for her completion thyroidectomy surgery. The patient has a history of 2 enlarged nodules within the right thyroid lobe. She underwent right hemithyroidectomy surgery 3 days ago. The patient was noted to have 2 foci of carcinoma. 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 The patient is therefore scheduled for removal of the residual left thyroid lobe.  Exam: General: Communicates without difficulty, well nourished, no acute distress. Head: Normocephalic, no evidence injury, no tenderness, facial buttresses intact without stepoff. Face/sinus: No tenderness to palpation and percussion. Facial movement is normal and symmetric. Eyes: PERRL, EOMI. No scleral icterus, conjunctivae clear. Neuro: CN II exam reveals vision grossly intact.  No nystagmus at any point of gaze. Ears: Auricles well formed without lesions.  Ear canals are intact without mass or lesion.  No erythema or edema is appreciated.  The TMs are intact without fluid. Nose: External evaluation reveals normal support and skin without lesions.  Dorsum is intact.  Anterior rhinoscopy reveals healthy pink mucosa over anterior aspect of inferior turbinates and intact septum.  No purulence noted. Oral:  Oral cavity and oropharynx are intact, symmetric, without erythema or edema.  Mucosa is moist without lesions. Neck: The neck incision is clean dry and intact.  Parotid glands and submandibular glands equal bilaterally without mass.  Trachea is midline. Neuro:  CN 2-12 grossly intact. Gait normal.   Assessment: 1.   Papillary and follicular thyroid carcinoma. The patient is here for removal of her residual thyroid tissue.  Plan: 1.  The pathology results are reviewed with the patient. 2.  The risks, benefits, alternatives and details of the completion thyroidectomy surgery are discussed with the patient. Questions are  invited and answered.  3.  The patient would like to proceed with the procedure.

## 2016-07-28 NOTE — Transfer of Care (Signed)
Immediate Anesthesia Transfer of Care Note  Patient: Linda Edwards  Procedure(s) Performed: Procedure(s): COMPLETION THYROIDECTOMY (N/A)  Patient Location: PACU  Anesthesia Type:General  Level of Consciousness: drowsy and patient cooperative  Airway & Oxygen Therapy: Patient Spontanous Breathing and Patient connected to face mask oxygen  Post-op Assessment: Report given to RN and Post -op Vital signs reviewed and stable  Post vital signs: Reviewed and stable  Last Vitals:  Vitals:   07/28/16 1001 07/28/16 1431  BP: 138/74   Pulse: 92   Resp: 18   Temp: 36.7 C 36.9 C    Last Pain:  Vitals:   07/28/16 1431  TempSrc: Oral         Complications: No apparent anesthesia complications

## 2016-07-29 DIAGNOSIS — K5900 Constipation, unspecified: Secondary | ICD-10-CM | POA: Diagnosis not present

## 2016-07-29 DIAGNOSIS — E669 Obesity, unspecified: Secondary | ICD-10-CM | POA: Diagnosis not present

## 2016-07-29 DIAGNOSIS — Z6832 Body mass index (BMI) 32.0-32.9, adult: Secondary | ICD-10-CM | POA: Diagnosis not present

## 2016-07-29 DIAGNOSIS — Z87891 Personal history of nicotine dependence: Secondary | ICD-10-CM | POA: Diagnosis not present

## 2016-07-29 DIAGNOSIS — E063 Autoimmune thyroiditis: Secondary | ICD-10-CM | POA: Diagnosis not present

## 2016-07-29 DIAGNOSIS — C73 Malignant neoplasm of thyroid gland: Secondary | ICD-10-CM | POA: Diagnosis not present

## 2016-07-29 LAB — CALCIUM
Calcium: 8.6 mg/dL — ABNORMAL LOW (ref 8.9–10.3)
Calcium: 9 mg/dL (ref 8.9–10.3)

## 2016-07-29 MED ORDER — CALCIUM CARBONATE-VITAMIN D 500-200 MG-UNIT PO TABS: 2.0000 | ORAL_TABLET | Freq: Two times a day (BID) | ORAL | 6 refills | Status: DC

## 2016-07-29 MED ORDER — DOCUSATE SODIUM 100 MG PO CAPS: 100.0000 mg | ORAL_CAPSULE | Freq: Two times a day (BID) | ORAL | 0 refills | Status: DC | PRN

## 2016-07-29 MED ORDER — LEVOTHYROXINE SODIUM 100 MCG PO TABS: 100.0000 ug | ORAL_TABLET | Freq: Every day | ORAL | 12 refills | Status: DC

## 2016-07-29 MED ORDER — OXYCODONE-ACETAMINOPHEN 5-325 MG PO TABS: 1.0000 | ORAL_TABLET | ORAL | 0 refills | Status: DC | PRN

## 2016-07-29 NOTE — Progress Notes (Signed)
Pt complaining of constipation, paged on call ENT MD [facility], waiting for return call.

## 2016-07-29 NOTE — Progress Notes (Signed)
Pt discharged going home with family member at the  Bedside, health teachings given, next appointment, wound dressing changed and instructed how to do the dressing, removed the peripheral IV line, given pain meds prior to discharge, given prescriptions for pain meds, accompanied by Nurse tech via wheelchair, given all personal belongings, no s/s of distress noted, wound site no bleeding noted.

## 2016-07-29 NOTE — Discharge Summary (Signed)
Physician Discharge Summary  Patient ID: Linda Edwards MRN: [id number] DOB/AGE: April 05, 1985 31 y.o.  Admit date: 07/28/2016 Discharge date: 07/29/2016  Admission Diagnoses: Thyroid carcinoma  Discharge Diagnoses: Thyroid carcinoma Active Problems:   H/O total thyroidectomy  Discharged Condition: good  Hospital Course: Pt had an uneventful overnight stay. Pt tolerated po well. No bleeding. No stridor. Voice is strong.  Consults: None  Significant Diagnostic Studies: None  Treatments: surgery: Completion thyroidectomy  Discharge Exam: Blood pressure 119/65, pulse 76, temperature 98.6 F (37 C), temperature source Oral, resp. rate 18, weight 171 lb (77.6 kg), last menstrual period 06/27/2016, SpO2 100 %. Incision/Wound:c/d/i Voice is strong  Disposition: 01-Home or Self Care  Discharge Instructions    Activity as tolerated - No restrictions    Complete by:  As directed    Diet general    Complete by:  As directed      Allergies as of 07/29/2016   No Known Allergies     Medication List    TAKE these medications   amoxicillin 875 MG tablet Commonly known as:  AMOXIL Take 1 tablet (875 mg total) by mouth 2 (two) times daily.   calcium-vitamin D 500-200 MG-UNIT tablet Commonly known as:  OSCAL WITH D Take 2 tablets by mouth 2 (two) times daily.   docusate sodium 100 MG capsule Commonly known as:  COLACE Take 1 capsule (100 mg total) by mouth 2 (two) times daily as needed for mild constipation or moderate constipation. Notes to patient:  If needed for constipation   levothyroxine 100 MCG tablet Commonly known as:  SYNTHROID Take 1 tablet (100 mcg total) by mouth daily before breakfast.   medroxyPROGESTERone 10 MG tablet Commonly known as:  PROVERA Take 1 tablet (10 mg total) by mouth daily.   oxyCODONE-acetaminophen 5-325 MG tablet Commonly known as:  ROXICET Take 1 tablet by mouth every 4 (four) hours as needed for severe pain. What changed:  Another  medication with the same name was added. Make sure you understand how and when to take each. Notes to patient:  If needed for pain   oxyCODONE-acetaminophen 5-325 MG tablet Commonly known as:  ROXICET Take 1 tablet by mouth every 4 (four) hours as needed for severe pain. What changed:  You were already taking a medication with the same name, and this prescription was added. Make sure you understand how and when to take each. Notes to patient:  If needed for pain      Follow-up Information    Leta Baptist, MD Follow up on 08/08/2016.   Specialty:  Otolaryngology Why:  as scheduled Contact information: [address] [place] [address] [phone]           Signed: Burley Saver 07/29/2016, 5:15 PM

## 2016-07-29 NOTE — Progress Notes (Signed)
Subjective: No difficulty overnight. Voice is strong. Tolerated po.  Objective: Vital signs in last 24 hours: Temp:  [97.8 F (36.6 C)-99 F (37.2 C)] 97.8 F (36.6 C) (06/30 0601) Pulse Rate:  [64-95] 82 (06/30 0601) Resp:  [12-19] 18 (06/30 0601) BP: (107-138)/(56-89) 118/57 (06/30 0601) SpO2:  [94 %-100 %] 99 % (06/30 0601) Weight:  [171 lb (77.6 kg)] 171 lb (77.6 kg) (06/29 1001)  Incision c/d/i No erythema.  No hematoma. Strong voice.   Recent Labs  07/28/16 0954  WBC 13.0*  HGB 12.7  HCT 39.2  PLT 306    Recent Labs  07/28/16 0954  07/28/16 2228 07/29/16 0623  NA 136  --   --   --   K 3.8  --   --   --   CL 103  --   --   --   CO2 25  --   --   --   GLUCOSE 86  --   --   --   BUN 6  --   --   --   CREATININE 0.63  --   --   --   CALCIUM 9.4  < > 8.7* 8.6*  < > = values in this interval not displayed.  Medications:  I have reviewed the patient's current medications. Scheduled: . amoxicillin  500 mg Oral Q12H  . calcium-vitamin D  2 tablet Oral BID   BBU:[id number] injection, ondansetron **OR** ondansetron (ZOFRAN) IV, oxyCODONE-acetaminophen  Assessment/Plan: POD #1 s/p completion thyroidectomy. Doing well. Await stabilization of her calcium level.   LOS: 0 days   Tyric Rodeheaver W Kaleigha Chamberlin 07/29/2016, 8:19 AM

## 2016-07-29 NOTE — Discharge Instructions (Signed)
Thyroidectomy, Care After Refer to this sheet in the next few weeks. These instructions provide you with information about caring for yourself after your procedure. Your health care provider may also give you more specific instructions. Your treatment has been planned according to current medical practices, but problems sometimes occur. Call your health care provider if you have any problems or questions after your procedure. What can I expect after the procedure? After your procedure, it is typical to have:  Mild pain in the neck or upper body, especially when swallowing.  A sore throat.  A weak voice.  Follow these instructions at home:  Take medicines only as directed by your health care provider.  If your entire thyroid gland was removed, you may need to take thyroid hormone medicine from now on.  Do not take medicines that contain aspirin and ibuprofen until your health care provider says that you can. These medicines can increase your risk of bleeding.  Some pain medicines cause constipation. Drink enough fluid to keep your urine clear or pale yellow. This can help to prevent constipation.  Start slowly with eating. You may need to have only liquids and soft foods for a few days or as directed by your health care provider.  Resume your usual activities as directed by your health care provider.  For the first 7 days after the procedure or as instructed by your health care provider: ? Do not lift anything heavier than 20 lb (9.1 kg). ? Do not jog, swim, or do other strenuous exercises. ? Do not play contact sports.  Keep all follow-up visits as directed by your health care provider. This is important. Contact a health care provider if:  The soreness in your throat gets worse.  You have increased pain at your incision or incisions.  You have increased bleeding from an incision.  Your incision becomes infected. Watch for: ? Swelling. ? Redness. ? Warmth. ? Pus.  You  notice a bad smell coming from an incision or dressing.  You have a fever.  You feel lightheaded or faint.  You have numbness, tingling, or muscle spasms in your: ? Arms. ? Hands. ? Feet. ? Face.  You have trouble swallowing. Get help right away if:  You develop a rash.  You have difficulty breathing.  You hear whistling noises coming from your chest.  You develop a cough that gets worse.  Your speech changes, or you have hoarseness that gets worse. This information is not intended to replace advice given to you by your health care provider. Make sure you discuss any questions you have with your health care provider. Document Released: 08/05/2004 Document Revised: 09/19/2015 Document Reviewed: 06/18/2013 Elsevier Interactive Patient Education  2018 [employer].

## 2016-07-29 NOTE — Progress Notes (Signed)
Dr. Benjamine Mola, Su called and ordered pt can be discharge today her Calcium level was 9.0.

## 2016-07-31 ENCOUNTER — Encounter (HOSPITAL_COMMUNITY): Payer: Self-pay | Admitting: Otolaryngology

## 2016-07-31 NOTE — Anesthesia Postprocedure Evaluation (Signed)
Anesthesia Post Note  Patient: Linda Edwards  Procedure(s) Performed: Procedure(s) (LRB): COMPLETION THYROIDECTOMY (N/A)     Patient location during evaluation: PACU Anesthesia Type: General Level of consciousness: awake and alert Pain management: pain level controlled Vital Signs Assessment: post-procedure vital signs reviewed and stable Respiratory status: spontaneous breathing, nonlabored ventilation, respiratory function stable and patient connected to nasal cannula oxygen Cardiovascular status: blood pressure returned to baseline and stable Postop Assessment: no signs of nausea or vomiting Anesthetic complications: no    Last Vitals:  Vitals:   07/29/16 0601 07/29/16 1432  BP: (!) 118/57 119/65  Pulse: 82 76  Resp: 18   Temp: 36.6 C 37 C    Last Pain:  Vitals:   07/29/16 1516  TempSrc:   PainSc: 4                  Jaymin Waln

## 2016-08-08 DIAGNOSIS — Z1329 Encounter for screening for other suspected endocrine disorder: Secondary | ICD-10-CM | POA: Diagnosis not present

## 2016-08-08 DIAGNOSIS — R791 Abnormal coagulation profile: Secondary | ICD-10-CM | POA: Diagnosis not present

## 2016-08-23 DIAGNOSIS — Z85850 Personal history of malignant neoplasm of thyroid: Secondary | ICD-10-CM | POA: Diagnosis not present

## 2016-08-23 DIAGNOSIS — E890 Postprocedural hypothyroidism: Secondary | ICD-10-CM | POA: Diagnosis not present

## 2016-09-29 DIAGNOSIS — E890 Postprocedural hypothyroidism: Secondary | ICD-10-CM | POA: Diagnosis not present

## 2016-09-29 DIAGNOSIS — C73 Malignant neoplasm of thyroid gland: Secondary | ICD-10-CM | POA: Diagnosis not present

## 2016-10-04 ENCOUNTER — Other Ambulatory Visit (HOSPITAL_COMMUNITY): Payer: Self-pay | Admitting: Endocrinology

## 2016-10-04 DIAGNOSIS — C73 Malignant neoplasm of thyroid gland: Secondary | ICD-10-CM

## 2016-10-23 ENCOUNTER — Ambulatory Visit (HOSPITAL_COMMUNITY)
Admission: RE | Admit: 2016-10-23 | Discharge: 2016-10-23 | Disposition: A | Discharge status: Home / Self Care | Payer: BLUE CROSS/BLUE SHIELD | Source: Ambulatory Visit | Attending: Endocrinology | Admitting: Endocrinology

## 2016-10-23 DIAGNOSIS — C73 Malignant neoplasm of thyroid gland: Secondary | ICD-10-CM | POA: Diagnosis not present

## 2016-10-23 MED ORDER — STERILE WATER FOR INJECTION IJ SOLN
INTRAMUSCULAR | Status: AC
  Administered 2016-10-23: 10 mL
  Filled 2016-10-23: qty 10

## 2016-10-23 MED ORDER — THYROTROPIN ALFA 1.1 MG IM SOLR
0.9000 mg | INTRAMUSCULAR | Status: AC
  Administered 2016-10-23: 0.9 mg via INTRAMUSCULAR

## 2016-10-24 ENCOUNTER — Ambulatory Visit (HOSPITAL_COMMUNITY)
Admission: RE | Admit: 2016-10-24 | Discharge: 2016-10-24 | Disposition: A | Discharge status: Home / Self Care | Payer: BLUE CROSS/BLUE SHIELD | Source: Ambulatory Visit | Attending: Endocrinology | Admitting: Endocrinology

## 2016-10-24 DIAGNOSIS — C73 Malignant neoplasm of thyroid gland: Secondary | ICD-10-CM | POA: Insufficient documentation

## 2016-10-24 MED ORDER — THYROTROPIN ALFA 1.1 MG IM SOLR
0.9000 mg | INTRAMUSCULAR | Status: AC
  Administered 2016-10-24: 0.9 mg via INTRAMUSCULAR

## 2016-10-24 MED ORDER — STERILE WATER FOR INJECTION IJ SOLN
INTRAMUSCULAR | Status: AC
Start: 2016-10-24 — End: 2016-10-24
  Filled 2016-10-24: qty 10

## 2016-10-25 ENCOUNTER — Ambulatory Visit (HOSPITAL_COMMUNITY)
Admission: RE | Admit: 2016-10-25 | Discharge: 2016-10-25 | Disposition: A | Discharge status: Home / Self Care | Payer: BLUE CROSS/BLUE SHIELD | Source: Ambulatory Visit | Attending: Endocrinology | Admitting: Endocrinology

## 2016-10-25 DIAGNOSIS — C73 Malignant neoplasm of thyroid gland: Secondary | ICD-10-CM | POA: Diagnosis not present

## 2016-10-25 LAB — HCG, SERUM, QUALITATIVE: PREG SERUM: NEGATIVE

## 2016-10-25 MED ORDER — SODIUM IODIDE I 131 CAPSULE
29.8000 | Freq: Once | INTRAVENOUS | Status: AC | PRN
  Administered 2016-10-25: 29.8 via ORAL

## 2016-11-01 ENCOUNTER — Ambulatory Visit (HOSPITAL_COMMUNITY)
Admission: RE | Admit: 2016-11-01 | Discharge: 2016-11-01 | Disposition: A | Discharge status: Home / Self Care | Payer: BLUE CROSS/BLUE SHIELD | Source: Ambulatory Visit | Attending: Endocrinology | Admitting: Endocrinology

## 2016-11-01 DIAGNOSIS — R9389 Abnormal findings on diagnostic imaging of other specified body structures: Secondary | ICD-10-CM | POA: Diagnosis not present

## 2016-11-01 DIAGNOSIS — C73 Malignant neoplasm of thyroid gland: Secondary | ICD-10-CM

## 2016-11-03 DIAGNOSIS — C73 Malignant neoplasm of thyroid gland: Secondary | ICD-10-CM | POA: Diagnosis not present

## 2016-11-03 DIAGNOSIS — E890 Postprocedural hypothyroidism: Secondary | ICD-10-CM | POA: Diagnosis not present

## 2016-12-11 ENCOUNTER — Telehealth: Payer: Self-pay | Admitting: *Deleted

## 2016-12-11 DIAGNOSIS — E282 Polycystic ovarian syndrome: Secondary | ICD-10-CM

## 2016-12-11 NOTE — Telephone Encounter (Signed)
Patient left message on nurse voicemail on 12/06/16 at 1647.  States she takes progesterone to start her periods.  States she started her period this time before she finished the medication and wants to know if she should continue the medication.  Requests a return call.

## 2016-12-12 NOTE — Telephone Encounter (Signed)
Called pt and left message stating that I am following up with her regarding her question. Also, we have received a refill request from her pharmacy and wanted to know if in fact she needs a refill.  Please cal back and leave a message with as much details as possible. *refill request for Provera received from [facility] on 11/9.

## 2016-12-13 MED ORDER — MEDROXYPROGESTERONE ACETATE 10 MG PO TABS: 10.0000 mg | ORAL_TABLET | Freq: Every day | ORAL | 3 refills | Status: DC

## 2016-12-13 NOTE — Addendum Note (Signed)
Addended by: Langston Reusing on: 12/13/2016 02:53 PM   Modules accepted: Orders

## 2016-12-13 NOTE — Telephone Encounter (Signed)
Received call left on nurse voicemail on 12/12/16 at 1809.  Patient states she does need the refill on her medication.  States she still needs an answer to her question.  I will send My Chart message to patient to let her know her refill is being sent to her pharmacy.  Spoke with Dr. Kennon Rounds about her question of whether to continue the medication once she starts her cycle.  Dr. Kennon Rounds says it is fine to stop the medication once her cycle starts but if she continues it based on directions, it won't hurt her in any way.  Will include this information in the My Chart message as well.

## 2016-12-13 NOTE — Addendum Note (Signed)
Addended by: Langston Reusing on: 12/13/2016 04:20 PM   Modules accepted: Orders

## 2017-01-24 DIAGNOSIS — E890 Postprocedural hypothyroidism: Secondary | ICD-10-CM | POA: Diagnosis not present

## 2017-01-24 DIAGNOSIS — C73 Malignant neoplasm of thyroid gland: Secondary | ICD-10-CM | POA: Diagnosis not present

## 2017-02-06 DIAGNOSIS — Z85850 Personal history of malignant neoplasm of thyroid: Secondary | ICD-10-CM | POA: Diagnosis not present

## 2017-04-06 ENCOUNTER — Other Ambulatory Visit: Payer: Self-pay | Admitting: Family Medicine

## 2017-04-06 ENCOUNTER — Ambulatory Visit (INDEPENDENT_AMBULATORY_CARE_PROVIDER_SITE_OTHER): Payer: BLUE CROSS/BLUE SHIELD | Admitting: Family Medicine

## 2017-04-06 ENCOUNTER — Encounter: Payer: Self-pay | Admitting: Family Medicine

## 2017-04-06 VITALS — BP 116/73 | HR 80 | Ht 61.0 in | Wt 170.5 lb

## 2017-04-06 DIAGNOSIS — E039 Hypothyroidism, unspecified: Secondary | ICD-10-CM | POA: Insufficient documentation

## 2017-04-06 DIAGNOSIS — E282 Polycystic ovarian syndrome: Secondary | ICD-10-CM

## 2017-04-06 DIAGNOSIS — Z01419 Encounter for gynecological examination (general) (routine) without abnormal findings: Secondary | ICD-10-CM

## 2017-04-06 DIAGNOSIS — E890 Postprocedural hypothyroidism: Secondary | ICD-10-CM

## 2017-04-06 DIAGNOSIS — R2231 Localized swelling, mass and lump, right upper limb: Secondary | ICD-10-CM

## 2017-04-06 DIAGNOSIS — C73 Malignant neoplasm of thyroid gland: Secondary | ICD-10-CM | POA: Insufficient documentation

## 2017-04-06 MED ORDER — MEDROXYPROGESTERONE ACETATE 10 MG PO TABS: 10.0000 mg | ORAL_TABLET | Freq: Every day | ORAL | 3 refills | Status: DC

## 2017-04-06 NOTE — Assessment & Plan Note (Signed)
Continue synthroid.

## 2017-04-06 NOTE — Assessment & Plan Note (Signed)
F/u per ENT

## 2017-04-06 NOTE — Patient Instructions (Signed)
Preventive Care 18-39 Years, Female Preventive care refers to lifestyle choices and visits with your health care provider that can promote health and wellness. What does preventive care include?  A yearly physical exam. This is also called an annual well check.  Dental exams once or twice a year.  Routine eye exams. Ask your health care provider how often you should have your eyes checked.  Personal lifestyle choices, including: ? Daily care of your teeth and gums. ? Regular physical activity. ? Eating a healthy diet. ? Avoiding tobacco and drug use. ? Limiting alcohol use. ? Practicing safe sex. ? Taking vitamin and mineral supplements as recommended by your health care provider. What happens during an annual well check? The services and screenings done by your health care provider during your annual well check will depend on your age, overall health, lifestyle risk factors, and family history of disease. Counseling Your health care provider may ask you questions about your:  Alcohol use.  Tobacco use.  Drug use.  Emotional well-being.  Home and relationship well-being.  Sexual activity.  Eating habits.  Work and work Statistician.  Method of birth control.  Menstrual cycle.  Pregnancy history.  Screening You may have the following tests or measurements:  Height, weight, and BMI.  Diabetes screening. This is done by checking your blood sugar (glucose) after you have not eaten for a while (fasting).  Blood pressure.  Lipid and cholesterol levels. These may be checked every 5 years starting at age 38.  Skin check.  Hepatitis C blood test.  Hepatitis B blood test.  Sexually transmitted disease (STD) testing.  BRCA-related cancer screening. This may be done if you have a family history of breast, ovarian, tubal, or peritoneal cancers.  Pelvic exam and Pap test. This may be done every 3 years starting at age 38. Starting at age 30, this may be done  every 5 years if you have a Pap test in combination with an HPV test.  Discuss your test results, treatment options, and if necessary, the need for more tests with your health care provider. Vaccines Your health care provider may recommend certain vaccines, such as:  Influenza vaccine. This is recommended every year.  Tetanus, diphtheria, and acellular pertussis (Tdap, Td) vaccine. You may need a Td booster every 10 years.  Varicella vaccine. You may need this if you have not been vaccinated.  HPV vaccine. If you are 39 or younger, you may need three doses over 6 months.  Measles, mumps, and rubella (MMR) vaccine. You may need at least one dose of MMR. You may also need a second dose.  Pneumococcal 13-valent conjugate (PCV13) vaccine. You may need this if you have certain conditions and were not previously vaccinated.  Pneumococcal polysaccharide (PPSV23) vaccine. You may need one or two doses if you smoke cigarettes or if you have certain conditions.  Meningococcal vaccine. One dose is recommended if you are age 68-21 years and a first-year college student living in a residence hall, or if you have one of several medical conditions. You may also need additional booster doses.  Hepatitis A vaccine. You may need this if you have certain conditions or if you travel or work in places where you may be exposed to hepatitis A.  Hepatitis B vaccine. You may need this if you have certain conditions or if you travel or work in places where you may be exposed to hepatitis B.  Haemophilus influenzae type b (Hib) vaccine. You may need this  if you have certain risk factors.  Talk to your health care provider about which screenings and vaccines you need and how often you need them. This information is not intended to replace advice given to you by your health care provider. Make sure you discuss any questions you have with your health care provider. Document Released: 03/14/2001 Document Revised:  10/06/2015 Document Reviewed: 11/17/2014 Elsevier Interactive Patient Education  2018 Elsevier Inc.  

## 2017-04-06 NOTE — Assessment & Plan Note (Addendum)
Continue provera to induce cycles if not coming regularly--to use condoms until ready to pursue fertility--has to wait due to RAI treatment.

## 2017-04-06 NOTE — Progress Notes (Signed)
Subjective:     Linda Edwards is a 32 y.o. female and is here for a comprehensive physical exam. The patient reports no problems. 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 Advised no pregnancy x 1 year. Has f/u at end of March. Husband noticed enlarged area under her right arm.  Social History   Socioeconomic History  . Marital status: Married    Spouse name: Not on file  . Number of children: Not on file  . Years of education: Not on file  . Highest education level: Not on file  Social Needs  . Financial resource strain: Not on file  . Food insecurity - worry: Not on file  . Food insecurity - inability: Not on file  . Transportation needs - medical: Not on file  . Transportation needs - non-medical: Not on file  Occupational History  . Not on file  Tobacco Use  . Smoking status: Former Smoker    Years: 2.00    Types: Cigarettes    Last attempt to quit: 01/30/2004    Years since quitting: 13.1  . Smokeless tobacco: Never Used  Substance and Sexual Activity  . Alcohol use: Yes    Comment: 07/28/2016 "glass of wine/month, maybe"  . Drug use: No  . Sexual activity: Yes    Birth control/protection: None  Other Topics Concern  . Not on file  Social History Narrative  . Not on file   Health Maintenance  Topic Date Due  . HIV Screening  02/19/2001  . TETANUS/TDAP  02/19/2005  . INFLUENZA VACCINE  08/30/2016  . PAP SMEAR  03/17/2019    The following portions of the patient's history were reviewed and updated as appropriate: allergies, current medications, past family history, past medical history, past social history, past surgical history and problem list.  Review of Systems Pertinent items noted in HPI and remainder of comprehensive ROS otherwise negative.   Objective:    BP 116/73   Pulse 80   Ht 5\' 1"  (1.549 m)   Wt 170 lb 8 oz (77.3 kg)   LMP 03/22/2017 (Exact Date)   BMI 32.22 kg/m  General appearance: alert,  cooperative and appears stated age Head: Normocephalic, without obvious abnormality, atraumatic Neck: no adenopathy, supple, symmetrical, trachea midline and surgical scar noted--no masses Lungs: clear to auscultation bilaterally Breasts: normal appearance, no masses or tenderness Heart: regular rate and rhythm, S1, S2 normal, no murmur, click, rub or gallop Abdomen: soft, non-tender; bowel sounds normal; no masses,  no organomegaly Extremities: extremities normal, atraumatic, no cyanosis or edema Pulses: 2+ and symmetric Skin: Skin color, texture, turgor normal. No rashes or lesions Lymph nodes: Axillary adenopathy: right Neurologic: Grossly normal    Assessment:    GYN female exam.      Plan:      Problem List Items Addressed This Visit      Unprioritized   PCOS (polycystic ovarian syndrome)    Continue provera to induce cycles if not coming regularly--to use condoms until ready to pursue fertility--has to wait due to RAI treatment.      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  See After Visit Summary for Counseling Recommendations

## 2017-04-12 ENCOUNTER — Ambulatory Visit
Admission: RE | Admit: 2017-04-12 | Discharge: 2017-04-12 | Disposition: A | Discharge status: Home / Self Care | Payer: BLUE CROSS/BLUE SHIELD | Source: Ambulatory Visit | Attending: Family Medicine | Admitting: Family Medicine

## 2017-04-12 DIAGNOSIS — R2231 Localized swelling, mass and lump, right upper limb: Secondary | ICD-10-CM

## 2017-04-12 DIAGNOSIS — N6489 Other specified disorders of breast: Secondary | ICD-10-CM | POA: Diagnosis not present

## 2017-04-12 DIAGNOSIS — R922 Inconclusive mammogram: Secondary | ICD-10-CM | POA: Diagnosis not present

## 2017-04-27 DIAGNOSIS — E890 Postprocedural hypothyroidism: Secondary | ICD-10-CM | POA: Diagnosis not present

## 2017-04-27 DIAGNOSIS — C73 Malignant neoplasm of thyroid gland: Secondary | ICD-10-CM | POA: Diagnosis not present

## 2017-05-04 DIAGNOSIS — C73 Malignant neoplasm of thyroid gland: Secondary | ICD-10-CM | POA: Diagnosis not present

## 2017-05-04 DIAGNOSIS — E890 Postprocedural hypothyroidism: Secondary | ICD-10-CM | POA: Diagnosis not present

## 2017-06-11 ENCOUNTER — Other Ambulatory Visit (HOSPITAL_COMMUNITY): Payer: Self-pay | Admitting: Endocrinology

## 2017-06-11 ENCOUNTER — Ambulatory Visit (HOSPITAL_COMMUNITY)
Admission: RE | Admit: 2017-06-11 | Discharge: 2017-06-11 | Disposition: A | Discharge status: Home / Self Care | Payer: BLUE CROSS/BLUE SHIELD | Source: Ambulatory Visit | Attending: Endocrinology | Admitting: Endocrinology

## 2017-06-11 DIAGNOSIS — C73 Malignant neoplasm of thyroid gland: Secondary | ICD-10-CM | POA: Diagnosis not present

## 2017-06-11 MED ORDER — STERILE WATER FOR INJECTION IJ SOLN
INTRAMUSCULAR | Status: AC
  Filled 2017-06-11: qty 10

## 2017-06-11 MED ORDER — THYROTROPIN ALFA 1.1 MG IM SOLR
0.9000 mg | INTRAMUSCULAR | Status: AC
  Administered 2017-06-11: 0.9 mg via INTRAMUSCULAR

## 2017-06-12 ENCOUNTER — Encounter (HOSPITAL_COMMUNITY)
Admission: RE | Admit: 2017-06-12 | Discharge: 2017-06-12 | Disposition: A | Discharge status: Home / Self Care | Payer: BLUE CROSS/BLUE SHIELD | Source: Ambulatory Visit | Attending: Endocrinology | Admitting: Endocrinology

## 2017-06-12 DIAGNOSIS — C73 Malignant neoplasm of thyroid gland: Secondary | ICD-10-CM | POA: Insufficient documentation

## 2017-06-12 MED ORDER — THYROTROPIN ALFA 1.1 MG IM SOLR
0.9000 mg | INTRAMUSCULAR | Status: AC
  Administered 2017-06-12: 0.9 mg via INTRAMUSCULAR

## 2017-06-12 MED ORDER — STERILE WATER FOR INJECTION IJ SOLN
INTRAMUSCULAR | Status: AC
  Filled 2017-06-12: qty 10

## 2017-06-13 ENCOUNTER — Encounter (HOSPITAL_COMMUNITY)
Admission: RE | Admit: 2017-06-13 | Discharge: 2017-06-13 | Disposition: A | Discharge status: Home / Self Care | Payer: BLUE CROSS/BLUE SHIELD | Source: Ambulatory Visit | Attending: Endocrinology | Admitting: Endocrinology

## 2017-06-13 DIAGNOSIS — C73 Malignant neoplasm of thyroid gland: Secondary | ICD-10-CM | POA: Insufficient documentation

## 2017-06-13 LAB — HCG, SERUM, QUALITATIVE: PREG SERUM: NEGATIVE

## 2017-06-15 ENCOUNTER — Encounter (HOSPITAL_COMMUNITY)
Admission: RE | Admit: 2017-06-15 | Discharge: 2017-06-15 | Disposition: A | Discharge status: Home / Self Care | Payer: BLUE CROSS/BLUE SHIELD | Source: Ambulatory Visit | Attending: Endocrinology | Admitting: Endocrinology

## 2017-06-15 DIAGNOSIS — C73 Malignant neoplasm of thyroid gland: Secondary | ICD-10-CM | POA: Diagnosis not present

## 2017-06-21 DIAGNOSIS — C73 Malignant neoplasm of thyroid gland: Secondary | ICD-10-CM | POA: Diagnosis not present

## 2017-06-21 DIAGNOSIS — E890 Postprocedural hypothyroidism: Secondary | ICD-10-CM | POA: Diagnosis not present

## 2017-07-09 ENCOUNTER — Other Ambulatory Visit (HOSPITAL_COMMUNITY): Payer: Self-pay | Admitting: Endocrinology

## 2017-07-09 DIAGNOSIS — C73 Malignant neoplasm of thyroid gland: Secondary | ICD-10-CM

## 2017-07-30 ENCOUNTER — Encounter (HOSPITAL_COMMUNITY)
Admission: RE | Admit: 2017-07-30 | Discharge: 2017-07-30 | Disposition: A | Discharge status: Home / Self Care | Payer: BLUE CROSS/BLUE SHIELD | Source: Ambulatory Visit | Attending: Endocrinology | Admitting: Endocrinology

## 2017-07-30 DIAGNOSIS — C73 Malignant neoplasm of thyroid gland: Secondary | ICD-10-CM | POA: Insufficient documentation

## 2017-07-30 MED ORDER — STERILE WATER FOR INJECTION IJ SOLN
INTRAMUSCULAR | Status: AC
  Administered 2017-07-30: 10 mL
  Filled 2017-07-30: qty 10

## 2017-07-30 MED ORDER — THYROTROPIN ALFA 1.1 MG IM SOLR
0.9000 mg | INTRAMUSCULAR | Status: AC
  Administered 2017-07-30: 0.9 mg via INTRAMUSCULAR

## 2017-07-31 ENCOUNTER — Encounter (HOSPITAL_COMMUNITY)
Admission: RE | Admit: 2017-07-31 | Discharge: 2017-07-31 | Disposition: A | Discharge status: Home / Self Care | Payer: BLUE CROSS/BLUE SHIELD | Source: Ambulatory Visit | Attending: Endocrinology | Admitting: Endocrinology

## 2017-07-31 DIAGNOSIS — C73 Malignant neoplasm of thyroid gland: Secondary | ICD-10-CM | POA: Diagnosis not present

## 2017-07-31 MED ORDER — STERILE WATER FOR INJECTION IJ SOLN
INTRAMUSCULAR | Status: AC
  Filled 2017-07-31: qty 10

## 2017-07-31 MED ORDER — THYROTROPIN ALFA 1.1 MG IM SOLR
0.9000 mg | INTRAMUSCULAR | Status: AC
  Administered 2017-07-31: 0.9 mg via INTRAMUSCULAR

## 2017-08-01 ENCOUNTER — Encounter (HOSPITAL_COMMUNITY)
Admission: RE | Admit: 2017-08-01 | Discharge: 2017-08-01 | Disposition: A | Discharge status: Home / Self Care | Payer: BLUE CROSS/BLUE SHIELD | Source: Ambulatory Visit | Attending: Endocrinology | Admitting: Endocrinology

## 2017-08-01 DIAGNOSIS — C73 Malignant neoplasm of thyroid gland: Secondary | ICD-10-CM | POA: Diagnosis not present

## 2017-08-01 LAB — HCG, SERUM, QUALITATIVE: Preg, Serum: NEGATIVE

## 2017-08-01 MED ORDER — SODIUM IODIDE I 131 CAPSULE
125.0000 | Freq: Once | INTRAVENOUS | Status: AC | PRN
  Administered 2017-08-01: 125 via ORAL

## 2017-08-08 ENCOUNTER — Encounter (HOSPITAL_COMMUNITY)
Admission: RE | Admit: 2017-08-08 | Discharge: 2017-08-08 | Disposition: A | Discharge status: Home / Self Care | Payer: BLUE CROSS/BLUE SHIELD | Source: Ambulatory Visit | Attending: Endocrinology | Admitting: Endocrinology

## 2017-08-08 DIAGNOSIS — C73 Malignant neoplasm of thyroid gland: Secondary | ICD-10-CM

## 2017-08-13 ENCOUNTER — Telehealth: Payer: Self-pay

## 2017-08-13 ENCOUNTER — Other Ambulatory Visit: Payer: Self-pay

## 2017-08-13 DIAGNOSIS — E282 Polycystic ovarian syndrome: Secondary | ICD-10-CM

## 2017-08-13 MED ORDER — MEDROXYPROGESTERONE ACETATE 10 MG PO TABS: 10.0000 mg | ORAL_TABLET | Freq: Every day | ORAL | 0 refills | Status: DC

## 2017-08-13 NOTE — Telephone Encounter (Signed)
Pt called thru Nurse line and stated that her insurance would only cover 3 months Rx for Medroxyprogesterone(Provera), so spoke with a provider and they ok'd 90 day supply. So re-sent Rx to pharmacy on file and notified pt.

## 2017-12-31 ENCOUNTER — Other Ambulatory Visit: Payer: Self-pay | Admitting: Family Medicine

## 2017-12-31 ENCOUNTER — Other Ambulatory Visit: Payer: Self-pay | Admitting: Nurse Practitioner

## 2017-12-31 DIAGNOSIS — R109 Unspecified abdominal pain: Secondary | ICD-10-CM

## 2018-01-02 ENCOUNTER — Ambulatory Visit
Admission: RE | Admit: 2018-01-02 | Discharge: 2018-01-02 | Disposition: A | Discharge status: Home / Self Care | Payer: BLUE CROSS/BLUE SHIELD | Source: Ambulatory Visit | Attending: Family Medicine | Admitting: Family Medicine

## 2018-01-02 DIAGNOSIS — R109 Unspecified abdominal pain: Secondary | ICD-10-CM

## 2019-05-27 ENCOUNTER — Ambulatory Visit (INDEPENDENT_AMBULATORY_CARE_PROVIDER_SITE_OTHER): Payer: BC Managed Care – PPO | Admitting: Family Medicine

## 2019-05-27 ENCOUNTER — Other Ambulatory Visit: Payer: Self-pay

## 2019-05-27 ENCOUNTER — Other Ambulatory Visit (HOSPITAL_COMMUNITY)
Admission: RE | Admit: 2019-05-27 | Discharge: 2019-05-27 | Disposition: A | Discharge status: Home / Self Care | Payer: BC Managed Care – PPO | Source: Ambulatory Visit | Attending: Family Medicine | Admitting: Family Medicine

## 2019-05-27 ENCOUNTER — Encounter: Payer: Self-pay | Admitting: Family Medicine

## 2019-05-27 VITALS — BP 117/77 | HR 71 | Ht 62.0 in | Wt 172.0 lb

## 2019-05-27 DIAGNOSIS — Z124 Encounter for screening for malignant neoplasm of cervix: Secondary | ICD-10-CM

## 2019-05-27 DIAGNOSIS — Z01419 Encounter for gynecological examination (general) (routine) without abnormal findings: Secondary | ICD-10-CM

## 2019-05-27 DIAGNOSIS — E282 Polycystic ovarian syndrome: Secondary | ICD-10-CM

## 2019-05-27 DIAGNOSIS — Z3169 Encounter for other general counseling and advice on procreation: Secondary | ICD-10-CM

## 2019-05-27 MED ORDER — MEDROXYPROGESTERONE ACETATE 10 MG PO TABS: 10.0000 mg | ORAL_TABLET | Freq: Every day | ORAL | 2 refills | Status: DC

## 2019-05-27 MED ORDER — LEVOTHYROXINE SODIUM 137 MCG PO TABS: 137.0000 ug | ORAL_TABLET | Freq: Every day | ORAL | Status: DC

## 2019-05-27 NOTE — Progress Notes (Signed)
  Subjective:     Linda Edwards is a 34 y.o. female and is here for a comprehensive physical exam. The patient reports problems - adjusting her synthroid which usually regulates her cycles. She desires pregnancy..   The following portions of the patient's history were reviewed and updated as appropriate: allergies, current medications, past family history, past medical history, past social history, past surgical history and problem list.  Review of Systems Pertinent items noted in HPI and remainder of comprehensive ROS otherwise negative.   Objective:    BP 117/77   Pulse 71   Ht 5\' 2"  (0000000 m)   Wt 172 lb (78 kg)   BMI 31.46 kg/m  General appearance: alert, cooperative and appears stated age Head: Normocephalic, without obvious abnormality, atraumatic Neck: no adenopathy, supple, symmetrical, trachea midline and thyroid not enlarged, symmetric, no tenderness/mass/nodules Lungs: clear to auscultation bilaterally Breasts: normal appearance, no masses or tenderness Heart: regular rate and rhythm, S1, S2 normal, no murmur, click, rub or gallop Abdomen: soft, non-tender; bowel sounds normal; no masses,  no organomegaly Pelvic: cervix normal in appearance, external genitalia normal, no adnexal masses or tenderness, no cervical motion tenderness, uterus normal size, shape, and consistency and vagina normal without discharge Extremities: extremities normal, atraumatic, no cyanosis or edema Pulses: 2+ and symmetric Skin: Skin color, texture, turgor normal. No rashes or lesions Lymph nodes: Cervical, supraclavicular, and axillary nodes normal. Neurologic: Grossly normal    Assessment:    Healthy female exam.      Plan:   Problem List Items Addressed This Visit      Unprioritized   PCOS (polycystic ovarian syndrome)    Provera if > 8 wks between cycles.      Relevant Medications   medroxyPROGESTERone (PROVERA) 10 MG tablet    Other Visit Diagnoses    Encounter for  preconception consultation    -  Primary   timing of intercourse, when to return for infertility, timing of intercourse discussed.   Screening for malignant neoplasm of cervix       Relevant Orders   Cytology - PAP   Encounter for gynecological examination without abnormal finding         Return in 1 year (on 05/26/2020).    See After Visit Summary for Counseling Recommendations

## 2019-05-27 NOTE — Progress Notes (Signed)
Discuss refilling the provera

## 2019-05-27 NOTE — Assessment & Plan Note (Signed)
Provera if > 8 wks between cycles.

## 2019-05-27 NOTE — Patient Instructions (Signed)
 Preventive Care 21-33 Years Old, Female Preventive care refers to visits with your health care provider and lifestyle choices that can promote health and wellness. This includes:  A yearly physical exam. This may also be called an annual well check.  Regular dental visits and eye exams.  Immunizations.  Screening for certain conditions.  Healthy lifestyle choices, such as eating a healthy diet, getting regular exercise, not using drugs or products that contain nicotine and tobacco, and limiting alcohol use. What can I expect for my preventive care visit? Physical exam Your health care provider will check your:  Height and weight. This may be used to calculate body mass index (BMI), which tells if you are at a healthy weight.  Heart rate and blood pressure.  Skin for abnormal spots. Counseling Your health care provider may ask you questions about your:  Alcohol, tobacco, and drug use.  Emotional well-being.  Home and relationship well-being.  Sexual activity.  Eating habits.  Work and work environment.  Method of birth control.  Menstrual cycle.  Pregnancy history. What immunizations do I need?  Influenza (flu) vaccine  This is recommended every year. Tetanus, diphtheria, and pertussis (Tdap) vaccine  You may need a Td booster every 10 years. Varicella (chickenpox) vaccine  You may need this if you have not been vaccinated. Human papillomavirus (HPV) vaccine  If recommended by your health care provider, you may need three doses over 6 months. Measles, mumps, and rubella (MMR) vaccine  You may need at least one dose of MMR. You may also need a second dose. Meningococcal conjugate (MenACWY) vaccine  One dose is recommended if you are age 19-21 years and a first-year college student living in a residence hall, or if you have one of several medical conditions. You may also need additional booster doses. Pneumococcal conjugate (PCV13) vaccine  You may need  this if you have certain conditions and were not previously vaccinated. Pneumococcal polysaccharide (PPSV23) vaccine  You may need one or two doses if you smoke cigarettes or if you have certain conditions. Hepatitis A vaccine  You may need this if you have certain conditions or if you travel or work in places where you may be exposed to hepatitis A. Hepatitis B vaccine  You may need this if you have certain conditions or if you travel or work in places where you may be exposed to hepatitis B. Haemophilus influenzae type b (Hib) vaccine  You may need this if you have certain conditions. You may receive vaccines as individual doses or as more than one vaccine together in one shot (combination vaccines). Talk with your health care provider about the risks and benefits of combination vaccines. What tests do I need?  Blood tests  Lipid and cholesterol levels. These may be checked every 5 years starting at age 20.  Hepatitis C test.  Hepatitis B test. Screening  Diabetes screening. This is done by checking your blood sugar (glucose) after you have not eaten for a while (fasting).  Sexually transmitted disease (STD) testing.  BRCA-related cancer screening. This may be done if you have a family history of breast, ovarian, tubal, or peritoneal cancers.  Pelvic exam and Pap test. This may be done every 3 years starting at age 21. Starting at age 30, this may be done every 5 years if you have a Pap test in combination with an HPV test. Talk with your health care provider about your test results, treatment options, and if necessary, the need for more   tests. Follow these instructions at home: Eating and drinking   Eat a diet that includes fresh fruits and vegetables, whole grains, lean protein, and low-fat dairy.  Take vitamin and mineral supplements as recommended by your health care provider.  Do not drink alcohol if: ? Your health care provider tells you not to drink. ? You are  pregnant, may be pregnant, or are planning to become pregnant.  If you drink alcohol: ? Limit how much you have to 0-1 drink a day. ? Be aware of how much alcohol is in your drink. In the [place] one drink equals one 12 oz bottle of beer (355 mL), one 5 oz glass of wine (148 mL), or one 1 oz glass of hard liquor (44 mL). Lifestyle  Take daily care of your teeth and gums.  Stay active. Exercise for at least 30 minutes on 5 or more days each week.  Do not use any products that contain nicotine or tobacco, such as cigarettes, e-cigarettes, and chewing tobacco. If you need help quitting, ask your health care provider.  If you are sexually active, practice safe sex. Use a condom or other form of birth control (contraception) in order to prevent pregnancy and STIs (sexually transmitted infections). If you plan to become pregnant, see your health care provider for a preconception visit. What's next?  Visit your health care provider once a year for a well check visit.  Ask your health care provider how often you should have your eyes and teeth checked.  Stay up to date on all vaccines. This information is not intended to replace advice given to you by your health care provider. Make sure you discuss any questions you have with your health care provider. Document Revised: 09/27/2017 Document Reviewed: 09/27/2017 Elsevier Patient Education  2020 Elsevier Inc.  

## 2019-05-29 LAB — CYTOLOGY - PAP
Comment: NEGATIVE
Diagnosis: NEGATIVE
High risk HPV: NEGATIVE

## 2019-11-18 ENCOUNTER — Other Ambulatory Visit: Payer: Self-pay | Admitting: Endocrinology

## 2019-11-18 ENCOUNTER — Other Ambulatory Visit (HOSPITAL_COMMUNITY): Payer: Self-pay | Admitting: Endocrinology

## 2019-11-18 DIAGNOSIS — C73 Malignant neoplasm of thyroid gland: Secondary | ICD-10-CM

## 2019-12-29 ENCOUNTER — Other Ambulatory Visit: Payer: Self-pay

## 2019-12-29 ENCOUNTER — Ambulatory Visit (HOSPITAL_COMMUNITY)
Admission: RE | Admit: 2019-12-29 | Discharge: 2019-12-29 | Disposition: A | Discharge status: Home / Self Care | Payer: BC Managed Care – PPO | Source: Ambulatory Visit | Attending: Endocrinology | Admitting: Endocrinology

## 2019-12-29 DIAGNOSIS — C73 Malignant neoplasm of thyroid gland: Secondary | ICD-10-CM

## 2019-12-29 MED ORDER — STERILE WATER FOR INJECTION IJ SOLN
INTRAMUSCULAR | Status: AC
  Filled 2019-12-29: qty 10

## 2019-12-29 MED ORDER — THYROTROPIN ALFA 0.9 MG IM SOLR
0.9000 mg | INTRAMUSCULAR | Status: AC
  Administered 2019-12-29: 0.9 mg via INTRAMUSCULAR

## 2019-12-29 MED ORDER — STERILE WATER FOR INJECTION IJ SOLN
1.0000 mL | Freq: Once | INTRAMUSCULAR | Status: AC
  Administered 2019-12-29: 1 mL via INTRAMUSCULAR

## 2019-12-30 ENCOUNTER — Encounter (HOSPITAL_COMMUNITY)
Admission: RE | Admit: 2019-12-30 | Discharge: 2019-12-30 | Disposition: A | Discharge status: Home / Self Care | Payer: BC Managed Care – PPO | Source: Ambulatory Visit | Attending: Endocrinology | Admitting: Endocrinology

## 2019-12-30 DIAGNOSIS — C73 Malignant neoplasm of thyroid gland: Secondary | ICD-10-CM | POA: Diagnosis present

## 2019-12-30 MED ORDER — STERILE WATER FOR INJECTION IJ SOLN
INTRAMUSCULAR | Status: AC
  Filled 2019-12-30: qty 10

## 2019-12-30 MED ORDER — THYROTROPIN ALFA 0.9 MG IM SOLR
0.9000 mg | INTRAMUSCULAR | Status: AC
  Administered 2019-12-30: 0.9 mg via INTRAMUSCULAR

## 2019-12-30 MED ORDER — STERILE WATER FOR INJECTION IJ SOLN
1.0000 mL | Freq: Once | INTRAMUSCULAR | Status: AC
  Administered 2019-12-30: 1 mL via INTRAMUSCULAR

## 2019-12-31 ENCOUNTER — Other Ambulatory Visit: Payer: Self-pay

## 2019-12-31 ENCOUNTER — Ambulatory Visit (HOSPITAL_COMMUNITY)
Admission: RE | Admit: 2019-12-31 | Discharge: 2019-12-31 | Disposition: A | Discharge status: Home / Self Care | Payer: BC Managed Care – PPO | Source: Ambulatory Visit | Attending: Endocrinology | Admitting: Endocrinology

## 2019-12-31 DIAGNOSIS — C73 Malignant neoplasm of thyroid gland: Secondary | ICD-10-CM | POA: Insufficient documentation

## 2019-12-31 LAB — HCG, SERUM, QUALITATIVE: Preg, Serum: NEGATIVE

## 2020-01-02 ENCOUNTER — Encounter (HOSPITAL_COMMUNITY)
Admission: RE | Admit: 2020-01-02 | Discharge: 2020-01-02 | Disposition: A | Discharge status: Home / Self Care | Payer: BC Managed Care – PPO | Source: Ambulatory Visit | Attending: Endocrinology | Admitting: Endocrinology

## 2020-01-02 ENCOUNTER — Other Ambulatory Visit: Payer: Self-pay

## 2020-01-02 DIAGNOSIS — C73 Malignant neoplasm of thyroid gland: Secondary | ICD-10-CM | POA: Diagnosis not present

## 2020-03-23 ENCOUNTER — Encounter: Payer: Self-pay | Admitting: Radiology

## 2020-08-24 DIAGNOSIS — E890 Postprocedural hypothyroidism: Secondary | ICD-10-CM | POA: Insufficient documentation

## 2021-09-06 DIAGNOSIS — Z1389 Encounter for screening for other disorder: Secondary | ICD-10-CM | POA: Diagnosis not present

## 2021-09-06 DIAGNOSIS — Z0000 Encounter for general adult medical examination without abnormal findings: Secondary | ICD-10-CM | POA: Diagnosis not present

## 2021-09-06 DIAGNOSIS — Z124 Encounter for screening for malignant neoplasm of cervix: Secondary | ICD-10-CM | POA: Diagnosis not present

## 2021-09-06 DIAGNOSIS — Z1239 Encounter for other screening for malignant neoplasm of breast: Secondary | ICD-10-CM | POA: Diagnosis not present

## 2021-09-06 DIAGNOSIS — Z1211 Encounter for screening for malignant neoplasm of colon: Secondary | ICD-10-CM | POA: Diagnosis not present

## 2021-09-09 DIAGNOSIS — Z0000 Encounter for general adult medical examination without abnormal findings: Secondary | ICD-10-CM | POA: Diagnosis not present

## 2021-09-14 ENCOUNTER — Other Ambulatory Visit (HOSPITAL_COMMUNITY)
Admission: RE | Admit: 2021-09-14 | Discharge: 2021-09-14 | Disposition: A | Discharge status: Home / Self Care | Payer: BC Managed Care – PPO | Source: Ambulatory Visit | Attending: Family Medicine | Admitting: Family Medicine

## 2021-09-14 ENCOUNTER — Encounter: Payer: Self-pay | Admitting: Family Medicine

## 2021-09-14 ENCOUNTER — Ambulatory Visit (INDEPENDENT_AMBULATORY_CARE_PROVIDER_SITE_OTHER): Payer: BC Managed Care – PPO | Admitting: Family Medicine

## 2021-09-14 VITALS — BP 129/82 | HR 112 | Ht 61.0 in | Wt 187.0 lb

## 2021-09-14 DIAGNOSIS — Z124 Encounter for screening for malignant neoplasm of cervix: Secondary | ICD-10-CM | POA: Diagnosis not present

## 2021-09-14 DIAGNOSIS — Z3169 Encounter for other general counseling and advice on procreation: Secondary | ICD-10-CM

## 2021-09-14 DIAGNOSIS — E890 Postprocedural hypothyroidism: Secondary | ICD-10-CM | POA: Diagnosis not present

## 2021-09-14 DIAGNOSIS — Z01419 Encounter for gynecological examination (general) (routine) without abnormal findings: Secondary | ICD-10-CM

## 2021-09-14 DIAGNOSIS — E8881 Metabolic syndrome: Secondary | ICD-10-CM | POA: Insufficient documentation

## 2021-09-14 DIAGNOSIS — E282 Polycystic ovarian syndrome: Secondary | ICD-10-CM | POA: Diagnosis not present

## 2021-09-14 DIAGNOSIS — E88819 Insulin resistance, unspecified: Secondary | ICD-10-CM | POA: Insufficient documentation

## 2021-09-14 DIAGNOSIS — E559 Vitamin D deficiency, unspecified: Secondary | ICD-10-CM

## 2021-09-14 HISTORY — DX: Vitamin D deficiency, unspecified: E55.9

## 2021-09-14 MED ORDER — PRENATAL VITAMIN 27-0.8 MG PO TABS: 1.0000 | ORAL_TABLET | Freq: Every day | ORAL | 3 refills | Status: AC

## 2021-09-14 MED ORDER — MEDROXYPROGESTERONE ACETATE 10 MG PO TABS: 10.0000 mg | ORAL_TABLET | Freq: Every day | ORAL | 2 refills | Status: DC

## 2021-09-14 MED ORDER — LETROZOLE 2.5 MG PO TABS: 2.5000 mg | ORAL_TABLET | Freq: Every day | ORAL | 1 refills | Status: DC

## 2021-09-14 NOTE — Patient Instructions (Addendum)
Take Provera to start cycle.  The day it starts is day #1. Take Femara Day 3-7.  Have sex on day 10-20 at least every other day.  On Day 30 take pregnancy test. If negative, restart Provera. If you get a cycle prior to day 30, that is day 1 and take Femara on day 3-7 again and repeat above.  If positive, schedule appointment for OB care.  Please take prenatal vitamin daily.  Preventive Care 77-34 Years Old, Female Preventive care refers to lifestyle choices and visits with your health care provider that can promote health and wellness. Preventive care visits are also called wellness exams. What can I expect for my preventive care visit? Counseling During your preventive care visit, your health care provider may ask about your: Medical history, including: Past medical problems. Family medical history. Pregnancy history. Current health, including: Menstrual cycle. Method of birth control. Emotional well-being. Home life and relationship well-being. Sexual activity and sexual health. Lifestyle, including: Alcohol, nicotine or tobacco, and drug use. Access to firearms. Diet, exercise, and sleep habits. Work and work Statistician. Sunscreen use. Safety issues such as seatbelt and bike helmet use. Physical exam Your health care provider may check your: Height and weight. These may be used to calculate your BMI (body mass index). BMI is a measurement that tells if you are at a healthy weight. Waist circumference. This measures the distance around your waistline. This measurement also tells if you are at a healthy weight and may help predict your risk of certain diseases, such as type 2 diabetes and high blood pressure. Heart rate and blood pressure. Body temperature. Skin for abnormal spots. What immunizations do I need?  Vaccines are usually given at various ages, according to a schedule. Your health care provider will recommend vaccines for you based on your age, medical history, and  lifestyle or other factors, such as travel or where you work. What tests do I need? Screening Your health care provider may recommend screening tests for certain conditions. This may include: Pelvic exam and Pap test. Lipid and cholesterol levels. Diabetes screening. This is done by checking your blood sugar (glucose) after you have not eaten for a while (fasting). Hepatitis B test. Hepatitis C test. HIV (human immunodeficiency virus) test. STI (sexually transmitted infection) testing, if you are at risk. BRCA-related cancer screening. This may be done if you have a family history of breast, ovarian, tubal, or peritoneal cancers. Talk with your health care provider about your test results, treatment options, and if necessary, the need for more tests. Follow these instructions at home: Eating and drinking  Eat a healthy diet that includes fresh fruits and vegetables, whole grains, lean protein, and low-fat dairy products. Take vitamin and mineral supplements as recommended by your health care provider. Do not drink alcohol if: Your health care provider tells you not to drink. You are pregnant, may be pregnant, or are planning to become pregnant. If you drink alcohol: Limit how much you have to 0-1 drink a day. Know how much alcohol is in your drink. In the [place] one drink equals one 12 oz bottle of beer (355 mL), one 5 oz glass of wine (148 mL), or one 1 oz glass of hard liquor (44 mL). Lifestyle Brush your teeth every morning and night with fluoride toothpaste. Floss one time each day. Exercise for at least 30 minutes 5 or more days each week. Do not use any products that contain nicotine or tobacco. These products include cigarettes, chewing tobacco,  and vaping devices, such as e-cigarettes. If you need help quitting, ask your health care provider. Do not use drugs. If you are sexually active, practice safe sex. Use a condom or other form of protection to prevent STIs. If you do not  wish to become pregnant, use a form of birth control. If you plan to become pregnant, see your health care provider for a prepregnancy visit. Find healthy ways to manage stress, such as: Meditation, yoga, or listening to music. Journaling. Talking to a trusted person. Spending time with friends and family. Minimize exposure to UV radiation to reduce your risk of skin cancer. Safety Always wear your seat belt while driving or riding in a vehicle. Do not drive: If you have been drinking alcohol. Do not ride with someone who has been drinking. If you have been using any mind-altering substances or drugs. While texting. When you are tired or distracted. Wear a helmet and other protective equipment during sports activities. If you have firearms in your house, make sure you follow all gun safety procedures. Seek help if you have been physically or sexually abused. What's next? Go to your health care provider once a year for an annual wellness visit. Ask your health care provider how often you should have your eyes and teeth checked. Stay up to date on all vaccines. This information is not intended to replace advice given to you by your health care provider. Make sure you discuss any questions you have with your health care provider. Document Revised: 07/14/2020 Document Reviewed: 07/14/2020 Elsevier Patient Education  [place].

## 2021-09-14 NOTE — Assessment & Plan Note (Signed)
99214 - discussed impact on fertility. Trial of Femara. Take Provera to start cycle.Take Femara Day 3-7. Timed intercourse day 10-20 at least every other day. On Day 30 take pregnancy test. If negative, restart cycle. If no pregnancy in 3 months, consider semen analysis and HSG for tubal patency. Please use ovulation prediction kits to ensure we are on enough meds. Can increase to Femara 5 mg daily if needed. Consider referral to REI if no pregnancy in 6 months, due to age.

## 2021-09-14 NOTE — Assessment & Plan Note (Signed)
99214 - given desire for pregnancy, will check TSH to ednsure between 2-3.

## 2021-09-14 NOTE — Progress Notes (Signed)
Subjective:     Linda Edwards is a 36 y.o. female and is here for a comprehensive physical exam. The patient reports problems - infertility . Has h/o PCOS on provera, taking ovulation prediciton kits without showing any ovulation.   The following portions of the patient's history were reviewed and updated as appropriate: allergies, current medications, past family history, past medical history, past social history, past surgical history, and problem list.  Review of Systems Pertinent items noted in HPI and remainder of comprehensive ROS otherwise negative.   Objective:    BP 129/82   Pulse (!) 112   Ht '5\' 1"'$  (1.549 m)   Wt 187 lb (84.8 kg)   LMP 08/05/2021 (Approximate)   BMI 35.33 kg/m  General appearance: alert, cooperative, and appears stated age Head: Normocephalic, without obvious abnormality, atraumatic Neck: no adenopathy, supple, symmetrical, trachea midline, and thyroid not enlarged, symmetric, no tenderness/mass/nodules Lungs: clear to auscultation bilaterally Breasts: normal appearance, no masses or tenderness Heart: regular rate and rhythm, S1, S2 normal, no murmur, click, rub or gallop Abdomen: soft, non-tender; bowel sounds normal; no masses,  no organomegaly Pelvic: cervix normal in appearance, external genitalia normal, no adnexal masses or tenderness, no cervical motion tenderness, uterus normal size, shape, and consistency, and vagina normal without discharge Extremities: extremities normal, atraumatic, no cyanosis or edema Pulses: 2+ and symmetric Skin: Skin color, texture, turgor normal. No rashes or lesions Lymph nodes: Cervical, supraclavicular, and axillary nodes normal. Neurologic: Grossly normal    Assessment:    Healthy female exam.      Plan:   Problem List Items Addressed This Visit       Unprioritized   PCOS (polycystic ovarian syndrome) - Primary    [phone] - discussed impact on fertility. Trial of Femara. Take Provera to start cycle.Take Femara  Day 3-7. Timed intercourse day 10-20 at least every other day. On Day 30 take pregnancy test. If negative, restart cycle. If no pregnancy in 3 months, consider semen analysis and HSG for tubal patency. Please use ovulation prediction kits to ensure we are on enough meds. Can increase to Femara 5 mg daily if needed. Consider referral to REI if no pregnancy in 6 months, due to age.         Relevant Medications   letrozole (FEMARA) 2.5 MG tablet   medroxyPROGESTERone (PROVERA) 10 MG tablet   Postoperative hypothyroidism    99214 - given desire for pregnancy, will check TSH to ednsure between 2-3.      Relevant Orders   TSH   Other Visit Diagnoses     Screening for malignant neoplasm of cervix       [phone]   Relevant Orders   Cytology - PAP   Encounter for gynecological examination without abnormal finding       99395   Encounter for preconception consultation       99214 - Begin PNVs daily   Relevant Medications   Prenatal Vit-Fe Fumarate-FA (PRENATAL VITAMIN) 27-0.8 MG TABS         See After Visit Summary for Counseling Recommendations

## 2021-09-14 NOTE — Progress Notes (Signed)
Annual  Last seen here 05/27/19  LMP:08/05/21 irregular  Last Pap:05/27/19 WNL pt wants pap today. Contraception:None and None desired. STD Screening: Declines  Family Hx of Breast Cancer: None   Pt wants to conceive and has not happen yet has been using at home ovulation test.  Pt wants to discuss.

## 2021-09-15 LAB — TSH: TSH: 1.47 u[IU]/mL (ref 0.450–4.500)

## 2021-09-16 LAB — CYTOLOGY - PAP
Adequacy: ABSENT
Comment: NEGATIVE
Comment: NEGATIVE
Comment: NEGATIVE
HPV 16: NEGATIVE
HPV 18 / 45: NEGATIVE
High risk HPV: POSITIVE — AB

## 2021-09-20 DIAGNOSIS — E782 Mixed hyperlipidemia: Secondary | ICD-10-CM | POA: Diagnosis not present

## 2021-11-11 DIAGNOSIS — C73 Malignant neoplasm of thyroid gland: Secondary | ICD-10-CM | POA: Diagnosis not present

## 2021-11-11 DIAGNOSIS — R202 Paresthesia of skin: Secondary | ICD-10-CM | POA: Diagnosis not present

## 2021-11-11 DIAGNOSIS — E559 Vitamin D deficiency, unspecified: Secondary | ICD-10-CM | POA: Diagnosis not present

## 2021-11-11 DIAGNOSIS — E890 Postprocedural hypothyroidism: Secondary | ICD-10-CM | POA: Diagnosis not present

## 2021-11-14 ENCOUNTER — Encounter: Payer: Self-pay | Admitting: Family Medicine

## 2021-11-16 ENCOUNTER — Ambulatory Visit (INDEPENDENT_AMBULATORY_CARE_PROVIDER_SITE_OTHER): Payer: BC Managed Care – PPO | Admitting: Family Medicine

## 2021-11-16 ENCOUNTER — Encounter: Payer: Self-pay | Admitting: Family Medicine

## 2021-11-16 ENCOUNTER — Other Ambulatory Visit (HOSPITAL_COMMUNITY)
Admission: RE | Admit: 2021-11-16 | Discharge: 2021-11-16 | Disposition: A | Discharge status: Home / Self Care | Payer: BC Managed Care – PPO | Source: Ambulatory Visit | Attending: Family Medicine | Admitting: Family Medicine

## 2021-11-16 VITALS — BP 126/88 | HR 87 | Wt 184.0 lb

## 2021-11-16 DIAGNOSIS — R87612 Low grade squamous intraepithelial lesion on cytologic smear of cervix (LGSIL): Secondary | ICD-10-CM | POA: Insufficient documentation

## 2021-11-16 DIAGNOSIS — N870 Mild cervical dysplasia: Secondary | ICD-10-CM | POA: Diagnosis not present

## 2021-11-16 NOTE — Progress Notes (Signed)
    GYNECOLOGY OFFICE COLPOSCOPY PROCEDURE NOTE  36 y.o. G0P0000 here for colposcopy for low-grade squamous intraepithelial neoplasia (LGSIL - encompassing HPV,mild dysplasia,CIN I) pap smear on 09/14/2021. Discussed role for HPV in cervical dysplasia, need for surveillance.  Patient gave informed written consent, time out was performed.  Placed in lithotomy position. Cervix viewed with speculum and colposcope after application of acetic acid.   Colposcopy adequate? Yes  acetowhite lesion(s) noted at SCJ; biopsies obtained.  ECC specimen obtained. All specimens were labeled and sent to pathology.  Chaperone was present during entire procedure.  Patient was given post procedure instructions.  Will follow up pathology and manage accordingly; patient will be contacted with results and recommendations.  Routine preventative health maintenance measures emphasized.  Donnamae Jude, MD 11/16/2021 11:20 AM

## 2021-11-16 NOTE — Progress Notes (Signed)
Patient presents for Colpo.  Abnormal pap: 09/14/21: LSIL +HPV   LMP: 11/14/21 pt on 3rd day of cycle.

## 2021-11-18 DIAGNOSIS — E890 Postprocedural hypothyroidism: Secondary | ICD-10-CM | POA: Diagnosis not present

## 2021-11-18 DIAGNOSIS — C73 Malignant neoplasm of thyroid gland: Secondary | ICD-10-CM | POA: Diagnosis not present

## 2021-11-18 DIAGNOSIS — E559 Vitamin D deficiency, unspecified: Secondary | ICD-10-CM | POA: Diagnosis not present

## 2021-11-18 LAB — SURGICAL PATHOLOGY

## 2022-04-08 ENCOUNTER — Ambulatory Visit
Admission: EM | Admit: 2022-04-08 | Discharge: 2022-04-08 | Disposition: A | Discharge status: Home / Self Care | Payer: BC Managed Care – PPO | Attending: Internal Medicine | Admitting: Internal Medicine

## 2022-04-08 DIAGNOSIS — M5442 Lumbago with sciatica, left side: Secondary | ICD-10-CM | POA: Diagnosis not present

## 2022-04-08 LAB — POCT URINALYSIS DIP (MANUAL ENTRY)
Bilirubin, UA: NEGATIVE
Glucose, UA: NEGATIVE mg/dL
Ketones, POC UA: NEGATIVE mg/dL
Nitrite, UA: NEGATIVE
Protein Ur, POC: NEGATIVE mg/dL
Spec Grav, UA: 1.02 (ref 1.010–1.025)
Urobilinogen, UA: 0.2 E.U./dL
pH, UA: 7.5 (ref 5.0–8.0)

## 2022-04-08 MED ORDER — METHOCARBAMOL 500 MG PO TABS: 500.0000 mg | ORAL_TABLET | Freq: Two times a day (BID) | ORAL | 0 refills | Status: DC | PRN

## 2022-04-08 MED ORDER — IBUPROFEN 600 MG PO TABS: 600.0000 mg | ORAL_TABLET | Freq: Four times a day (QID) | ORAL | 0 refills | Status: DC | PRN

## 2022-04-08 NOTE — Discharge Instructions (Signed)
Suspect that you have low back pain with sciatica.  I have prescribed a muscle relaxer and anti-inflammatory medication.  Please be advised that muscle relaxer can make you drowsy and do not drive or drink alcohol with taking it.  Do not take any additional over-the-counter ibuprofen, Advil, Aleve while taking this prescription ibuprofen.  Take medication with food.  Urine culture is pending.  We will call if it is abnormal.  Follow-up if any symptoms persist or worsen.

## 2022-04-08 NOTE — ED Provider Notes (Addendum)
[facility]    CSN: [id number] Arrival date & time: 04/08/22  0858      History   Chief Complaint Chief Complaint  Patient presents with   Back Pain    HPI Linda Edwards is a 37 y.o. female.   Patient presents with left lower back pain that started last night.  Patient reports that the pain radiates down her buttocks and into her left leg.  She denies numbness or tingling.  Denies any urinary symptoms, urinary or bowel continence, saddle anesthesia, abdominal pain, hematuria.  Patient reports that she did have a more intense workout yesterday morning while running but is not sure if this is related.  She denies any obvious injury to the area.  Has not taken any medications to alleviate pain. Last menstrual cycle was 03/10/22.    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 Marland KitchenArchie Endo 07/28/2016)    Patient Active Problem List   Diagnosis Date Noted   LGSIL on Pap smear of cervix 11/16/2021   Insulin resistance 09/14/2021   Vitamin D deficiency 09/14/2021   Postoperative hypothyroidism 08/24/2020   Papillary carcinoma of thyroid ([place]) 04/06/2017   Hypothyroid 04/06/2017   H/O total thyroidectomy 07/28/2016   Swollen lymph nodes 07/03/2016   PCOS (polycystic ovarian syndrome) 03/28/2016    Past Surgical History:  Procedure Laterality Date   THYROID LOBECTOMY Left 07/28/2016   THYROIDECTOMY Right 07/25/2016   Procedure: RIGHT HEMI THYROIDECTOMY;  Surgeon: Leta Baptist, MD;  Location: [facility];  Service: ENT;  Laterality: Right;   THYROIDECTOMY N/A 07/28/2016   Procedure: COMPLETION THYROIDECTOMY;  Surgeon: Leta Baptist, MD;  [facility];  Service: ENT;  Laterality: N/A;    OB History     Gravida  0   Para  0   Term  0   Preterm  0   AB  0   Living  0       SAB  0   IAB  0   Ectopic  0   Multiple  0   Live Births  0            Home Medications    Prior to Admission medications   Medication Sig Start Date End Date Taking? Authorizing Provider  ibuprofen (ADVIL) 600 MG tablet Take 1 tablet (600 mg total) by mouth every 6 (six) hours as needed for mild pain. 04/08/22  Yes Merina Behrendt, Michele Rockers, FNP  methocarbamol (ROBAXIN) 500 MG tablet Take 1 tablet (500 mg total) by mouth 2 (two) times daily as needed for muscle spasms. 04/08/22  Yes Demontrez Rindfleisch, Michele Rockers, FNP  Cholecalciferol (VITAMIN D) 50 MCG (2000 UT) CAPS 1 capsule Orally Once a day    [provider]  docusate sodium (COLACE) 100 MG capsule Take 1 capsule (100 mg total) by mouth 2 (two) times daily as needed for mild constipation or moderate constipation. Patient not taking: Reported on 05/27/2019 07/29/16   Leta Baptist, MD  letrozole [facility]) 2.5 MG tablet Take 1 tablet (2.5 mg total) by mouth daily. 09/14/21   Donnamae Jude, MD  levothyroxine (SYNTHROID) 137 MCG tablet Take 1 tablet (137 mcg total) by mouth daily before breakfast. 05/27/19   Donnamae Jude, MD  medroxyPROGESTERone (PROVERA) 10 MG tablet Take 1 tablet (10 mg  total) by mouth daily. 09/14/21   Donnamae Jude, MD  Prenatal Vit-Fe Fumarate-FA (PRENATAL VITAMIN) 27-0.8 MG TABS Take 1 tablet by mouth daily. 09/14/21   Donnamae Jude, MD    Family History Family History  Problem Relation Age of Onset   Lung cancer Mother    Lupus Sister    Diabetes Maternal Grandfather     Social History Social History   Tobacco Use   Smoking status: Former    Years: 2.00    Types: Cigarettes    Quit date: 01/30/2004    Years since quitting: 18.2   Smokeless tobacco: Never  Vaping Use   Vaping Use: Never used  Substance Use Topics   Alcohol use: Yes    Comment: "glass of wine/month, maybe"   Drug use: No     Allergies   Other   Review of Systems Review of Systems Per HPI  Physical Exam Triage Vital Signs ED Triage  Vitals  Enc Vitals Group     BP 04/08/22 0912 120/82     Pulse Rate 04/08/22 0912 79     Resp 04/08/22 0912 20     Temp 04/08/22 0912 98.2 F (36.8 C)     Temp Source 04/08/22 0912 Oral     SpO2 04/08/22 0912 97 %     Weight --      Height --      Head Circumference --      Peak Flow --      Pain Score 04/08/22 0920 6     Pain Loc --      Pain Edu? --      Excl. in [place]? --    No data found.  Updated Vital Signs BP 120/82 (BP Location: Left Arm)   Pulse 79   Temp 98.2 F (36.8 C) (Oral)   Resp 20   LMP 03/03/2022   SpO2 97%   Visual Acuity Right Eye Distance:   Left Eye Distance:   Bilateral Distance:    Right Eye Near:   Left Eye Near:    Bilateral Near:     Physical Exam Constitutional:      General: She is not in acute distress.    Appearance: Normal appearance. She is not toxic-appearing or diaphoretic.  HENT:     Head: Normocephalic and atraumatic.  Eyes:     Extraocular Movements: Extraocular movements intact.     Conjunctiva/sclera: Conjunctivae normal.  Pulmonary:     Effort: Pulmonary effort is normal.  Musculoskeletal:     Lumbar back: No swelling, edema or bony tenderness. Positive left straight leg raise test. Negative right straight leg raise test.       Back:     Comments: Tenderness to palpation to various areas of lower left back.  No direct spinal tenderness, crepitus, step-off noted.  Neurological:     General: No focal deficit present.     Mental Status: She is alert and oriented to person, place, and time. Mental status is at baseline.  Psychiatric:        Mood and Affect: Mood normal.        Behavior: Behavior normal.        Thought Content: Thought content normal.        Judgment: Judgment normal.      UC Treatments / Results  Labs (all labs ordered are listed, but only abnormal results are displayed) Labs Reviewed  POCT URINALYSIS DIP (MANUAL ENTRY) - Abnormal; Notable for the following components:  Result Value    Clarity, UA hazy (*)    Blood, UA trace-intact (*)    Leukocytes, UA Small (1+) (*)    All other components within normal limits  URINE CULTURE    EKG   Radiology No results found.  Procedures Procedures (including critical care time)  Medications Ordered in UC Medications - No data to display  Initial Impression / Assessment and Plan / UC Course  I have reviewed the triage vital signs and the nursing notes.  Pertinent labs & imaging results that were available during my care of the patient were reviewed by me and considered in my medical decision making (see chart for details).     Physical exam is consistent with low back pain with sciatica.  Given no direct injury or direct spinal tenderness, will defer imaging.  Possible muscular strain as well.  UA showed small amount of leukocytes but given absence of urinary symptoms and pain being reproducible with palpation, I am concerned that this is more musculoskeletal in nature.  Will send urine culture to confirm. will treat with muscle relaxer and NSAID.  Patient to not take any additional NSAIDs while taking prescription ibuprofen.  She reports that she has taken ibuprofen before and tolerated well.  Advised muscle relaxer can make her drowsy and do not drive or drink alcohol with taking it.  Advised supportive care.  Advised return precautions.  Patient verbalized understanding and was agreeable with plan. Final Clinical Impressions(s) / UC Diagnoses   Final diagnoses:  Acute left-sided low back pain with left-sided sciatica     Discharge Instructions      Suspect that you have low back pain with sciatica.  I have prescribed a muscle relaxer and anti-inflammatory medication.  Please be advised that muscle relaxer can make you drowsy and do not drive or drink alcohol with taking it.  Do not take any additional over-the-counter ibuprofen, Advil, Aleve while taking this prescription ibuprofen.  Take medication with food.  Urine  culture is pending.  We will call if it is abnormal.  Follow-up if any symptoms persist or worsen.     ED Prescriptions     Medication Sig Dispense Auth. Provider   ibuprofen (ADVIL) 600 MG tablet Take 1 tablet (600 mg total) by mouth every 6 (six) hours as needed for mild pain. 30 tablet [place], [place], [place]   methocarbamol (ROBAXIN) 500 MG tablet Take 1 tablet (500 mg total) by mouth 2 (two) times daily as needed for muscle spasms. 20 tablet [place], Michele Rockers, [place]      PDMP not reviewed this encounter.   Teodora Medici, [place] 04/08/22 [place], [place], [place] 04/08/22 1008

## 2022-04-08 NOTE — ED Triage Notes (Signed)
Patient presents to UC for back pain since last night. Has not taken anything for pain. States she had exercised in the morning and believes she pinches something.

## 2022-04-10 LAB — URINE CULTURE: Culture: NO GROWTH

## 2022-12-22 DIAGNOSIS — E559 Vitamin D deficiency, unspecified: Secondary | ICD-10-CM | POA: Diagnosis not present

## 2022-12-22 DIAGNOSIS — E890 Postprocedural hypothyroidism: Secondary | ICD-10-CM | POA: Diagnosis not present

## 2022-12-22 DIAGNOSIS — C73 Malignant neoplasm of thyroid gland: Secondary | ICD-10-CM | POA: Diagnosis not present

## 2023-01-05 DIAGNOSIS — E890 Postprocedural hypothyroidism: Secondary | ICD-10-CM | POA: Diagnosis not present

## 2023-01-05 DIAGNOSIS — R202 Paresthesia of skin: Secondary | ICD-10-CM | POA: Diagnosis not present

## 2023-01-05 DIAGNOSIS — E559 Vitamin D deficiency, unspecified: Secondary | ICD-10-CM | POA: Diagnosis not present

## 2023-01-05 DIAGNOSIS — C73 Malignant neoplasm of thyroid gland: Secondary | ICD-10-CM | POA: Diagnosis not present

## 2023-02-07 DIAGNOSIS — E8881 Metabolic syndrome: Secondary | ICD-10-CM | POA: Diagnosis not present

## 2023-02-07 DIAGNOSIS — E282 Polycystic ovarian syndrome: Secondary | ICD-10-CM | POA: Diagnosis not present

## 2023-04-05 DIAGNOSIS — E282 Polycystic ovarian syndrome: Secondary | ICD-10-CM | POA: Diagnosis not present

## 2023-04-05 DIAGNOSIS — E559 Vitamin D deficiency, unspecified: Secondary | ICD-10-CM | POA: Diagnosis not present

## 2023-04-05 DIAGNOSIS — N912 Amenorrhea, unspecified: Secondary | ICD-10-CM | POA: Diagnosis not present

## 2023-04-05 DIAGNOSIS — E890 Postprocedural hypothyroidism: Secondary | ICD-10-CM | POA: Diagnosis not present

## 2023-04-10 ENCOUNTER — Other Ambulatory Visit (INDEPENDENT_AMBULATORY_CARE_PROVIDER_SITE_OTHER): Payer: Self-pay

## 2023-04-10 ENCOUNTER — Ambulatory Visit: Payer: BC Managed Care – PPO | Admitting: *Deleted

## 2023-04-10 VITALS — BP 135/83 | HR 83 | Wt 174.0 lb

## 2023-04-10 DIAGNOSIS — O0990 Supervision of high risk pregnancy, unspecified, unspecified trimester: Secondary | ICD-10-CM | POA: Insufficient documentation

## 2023-04-10 DIAGNOSIS — O3680X0 Pregnancy with inconclusive fetal viability, not applicable or unspecified: Secondary | ICD-10-CM

## 2023-04-10 DIAGNOSIS — Z3401 Encounter for supervision of normal first pregnancy, first trimester: Secondary | ICD-10-CM

## 2023-04-10 DIAGNOSIS — Z3A01 Less than 8 weeks gestation of pregnancy: Secondary | ICD-10-CM

## 2023-04-10 NOTE — Progress Notes (Signed)
 New OB Intake  I explained I am completing New OB Intake today. We discussed EDD of 11/26/2023, by Ultrasound. Pt is G1P0000. I reviewed her allergies, medications and Medical/Surgical/OB history.    Patient Active Problem List   Diagnosis Date Noted   Supervision of high risk pregnancy, antepartum 04/10/2023   LGSIL on Pap smear of cervix 11/16/2021   Insulin resistance 09/14/2021   Vitamin D deficiency 09/14/2021   Postoperative hypothyroidism 08/24/2020   Papillary carcinoma of thyroid (HCC) 04/06/2017   Hypothyroid 04/06/2017   H/O total thyroidectomy 07/28/2016   Swollen lymph nodes 07/03/2016   PCOS (polycystic ovarian syndrome) 03/28/2016    Concerns addressed today  Patient informed that the ultrasound is considered a limited obstetric ultrasound and is not intended to be a complete ultrasound exam.  Patient also informed that the ultrasound is not being completed with the intent of assessing for fetal or placental anomalies or any pelvic abnormalities. Explained that the purpose of today's ultrasound is to assess for viability.  Patient acknowledges the purpose of the exam and the limitations of the study.     Delivery Plans Plans to deliver at [facility]. Discussed the nature of our practice with multiple providers including residents and students. Due to the size of the practice, the delivering provider may not be the same as those providing prenatal care.   MyChart/Babyscripts MyChart access verified. I explained pt will have some visits in office and some virtually. Babyscripts app discussed and ordered.   Blood Pressure Cuff Blood pressure cuff discussed and given Discussed to be used for virtual visits and or if needed BP checks weekly.  Anatomy US Explained first scheduled [place] will be around 19 weeks.   Last Pap Diagnosis  Date Value Ref Range Status  09/14/2021 - Low grade squamous intraepithelial lesion (LSIL) (A)  Final   Had colpo on 10/2021-needs repeat pap    First visit review I reviewed new OB appt with patient. Explained pt will be seen by Dr Shawnie Pons at first visit. Discussed Avelina Laine genetic screening with patient will get Mat 21 at [facility]. Routine prenatal labs collected today.    Scheryl Marten, RN 04/10/2023  10:13 AM

## 2023-04-11 LAB — CBC/D/PLT+RPR+RH+ABO+RUBIGG...
Antibody Screen: NEGATIVE
Basophils Absolute: 0 10*3/uL (ref 0.0–0.2)
Basos: 0 %
EOS (ABSOLUTE): 0.1 10*3/uL (ref 0.0–0.4)
Eos: 1 %
HCV Ab: NONREACTIVE
HIV Screen 4th Generation wRfx: NONREACTIVE
Hematocrit: 40.3 % (ref 34.0–46.6)
Hemoglobin: 12.9 g/dL (ref 11.1–15.9)
Hepatitis B Surface Ag: NEGATIVE
Immature Grans (Abs): 0 10*3/uL (ref 0.0–0.1)
Immature Granulocytes: 0 %
Lymphocytes Absolute: 2 10*3/uL (ref 0.7–3.1)
Lymphs: 22 %
MCH: 27.6 pg (ref 26.6–33.0)
MCHC: 32 g/dL (ref 31.5–35.7)
MCV: 86 fL (ref 79–97)
Monocytes Absolute: 0.4 10*3/uL (ref 0.1–0.9)
Monocytes: 4 %
Neutrophils Absolute: 6.7 10*3/uL (ref 1.4–7.0)
Neutrophils: 73 %
Platelets: 370 10*3/uL (ref 150–450)
RBC: 4.67 x10E6/uL (ref 3.77–5.28)
RDW: 13.1 % (ref 11.7–15.4)
RPR Ser Ql: REACTIVE — AB
Rh Factor: POSITIVE
Rubella Antibodies, IGG: 20.2 {index} (ref >0.99)
WBC: 9.3 10*3/uL (ref 3.4–10.8)

## 2023-04-11 LAB — RPR, QUANT+TP ABS (REFLEX)
Rapid Plasma Reagin, Quant: 1:1 {titer} — ABNORMAL HIGH
T Pallidum Abs: NONREACTIVE

## 2023-04-11 LAB — HCV INTERPRETATION

## 2023-04-12 ENCOUNTER — Encounter: Payer: Self-pay | Admitting: Obstetrics & Gynecology

## 2023-04-12 DIAGNOSIS — E559 Vitamin D deficiency, unspecified: Secondary | ICD-10-CM | POA: Diagnosis not present

## 2023-04-12 DIAGNOSIS — R202 Paresthesia of skin: Secondary | ICD-10-CM | POA: Diagnosis not present

## 2023-04-12 DIAGNOSIS — E890 Postprocedural hypothyroidism: Secondary | ICD-10-CM | POA: Diagnosis not present

## 2023-04-12 DIAGNOSIS — C73 Malignant neoplasm of thyroid gland: Secondary | ICD-10-CM | POA: Diagnosis not present

## 2023-05-02 ENCOUNTER — Encounter: Payer: Self-pay | Admitting: Family Medicine

## 2023-05-02 ENCOUNTER — Ambulatory Visit (INDEPENDENT_AMBULATORY_CARE_PROVIDER_SITE_OTHER): Payer: BC Managed Care – PPO | Admitting: Family Medicine

## 2023-05-02 VITALS — BP 116/78 | HR 88 | Wt 173.0 lb

## 2023-05-02 DIAGNOSIS — O0990 Supervision of high risk pregnancy, unspecified, unspecified trimester: Secondary | ICD-10-CM | POA: Diagnosis not present

## 2023-05-02 DIAGNOSIS — E890 Postprocedural hypothyroidism: Secondary | ICD-10-CM

## 2023-05-02 DIAGNOSIS — O99281 Endocrine, nutritional and metabolic diseases complicating pregnancy, first trimester: Secondary | ICD-10-CM | POA: Diagnosis not present

## 2023-05-02 DIAGNOSIS — Z1339 Encounter for screening examination for other mental health and behavioral disorders: Secondary | ICD-10-CM | POA: Diagnosis not present

## 2023-05-02 DIAGNOSIS — Z3A10 10 weeks gestation of pregnancy: Secondary | ICD-10-CM | POA: Diagnosis not present

## 2023-05-02 DIAGNOSIS — Z124 Encounter for screening for malignant neoplasm of cervix: Secondary | ICD-10-CM

## 2023-05-02 DIAGNOSIS — O0991 Supervision of high risk pregnancy, unspecified, first trimester: Secondary | ICD-10-CM | POA: Diagnosis not present

## 2023-05-02 DIAGNOSIS — R768 Other specified abnormal immunological findings in serum: Secondary | ICD-10-CM | POA: Insufficient documentation

## 2023-05-02 HISTORY — DX: Other specified abnormal immunological findings in serum: R76.8

## 2023-05-02 NOTE — Progress Notes (Signed)
 Subjective:   Linda Edwards is a 38 y.o. G1P0000 at [redacted]w[redacted]d by early ultrasound being seen today for her first obstetrical visit.  Her obstetrical history is significant for advanced maternal age and h/o thyroid cancer . Patient does intend to breast feed. Pregnancy history fully reviewed.  Patient reports nausea.  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 Marland KitchenHattie Perch 07/28/2016)   Past Surgical History:  Procedure Laterality Date   THYROID LOBECTOMY Left 07/28/2016   THYROIDECTOMY Right 07/25/2016   Procedure: RIGHT HEMI THYROIDECTOMY;  Surgeon: Newman Pies, MD;  [facility];  Service: ENT;  Laterality: Right;   THYROIDECTOMY N/A 07/28/2016   Procedure: COMPLETION THYROIDECTOMY;  Surgeon: Newman Pies, MD;  [facility];  Service: ENT;  Laterality: N/A;   Family History  Problem Relation Age of Onset   Lung cancer Mother    Lupus Sister    Diabetes Maternal Grandfather    Social History   Tobacco Use   Smoking status: Former    Current packs/day: 0.00    Types: Cigarettes    Start date: 01/29/2002    Quit date: 01/30/2004    Years since quitting: 19.2   Smokeless tobacco: Never  Vaping Use   Vaping status: Never Used  Substance Use Topics   Alcohol use: Yes    Comment: "glass of wine/month, maybe"   Drug use: No   Allergies  Allergen Reactions   Other Diarrhea    Penicillin    Current Outpatient Medications on File Prior to Visit  Medication Sig Dispense Refill   Cholecalciferol (VITAMIN D) 50 MCG (2000  UT) CAPS 1 capsule Orally Once a day     levothyroxine (SYNTHROID) 137 MCG tablet Take 1 tablet (137 mcg total) by mouth daily before breakfast. (Patient taking differently: Take 125 mcg by mouth daily before breakfast.)     Prenatal Vit-Fe Fumarate-FA (PRENATAL VITAMIN) 27-0.8 MG TABS Take 1 tablet by mouth daily. 90 tablet 3   No current facility-administered medications on file prior to visit.     Exam   Vitals:   05/02/23 1002  BP: 116/78  Pulse: 88  Weight: 173 lb (78.5 kg)   Fetal Heart Rate (bpm): 168  Uterus:     Pelvic Exam: Perineum: no hemorrhoids, normal perineum   Vulva: normal external genitalia, no lesions   Vagina:  normal mucosa, normal discharge   Cervix: no lesions and normal, pap smear done.    Adnexa: normal adnexa and no mass, fullness, tenderness   Bony Pelvis: average  System: General: well-developed, well-nourished female in no acute distress   Breast:  normal appearance, no masses or tenderness   Skin: normal coloration and turgor, no rashes   Neurologic: oriented, normal, negative, normal mood   Extremities: normal strength, tone, and muscle mass, ROM of all joints is normal   HEENT PERRLA, extraocular movement intact and sclera  clear, anicteric   Mouth/Teeth mucous membranes moist, pharynx normal without lesions and dental hygiene good   Neck supple and no masses   Cardiovascular: regular rate and rhythm   Respiratory:  no respiratory distress, normal breath sounds   Abdomen: soft, non-tender; bowel sounds normal; no masses,  no organomegaly     Assessment:   Pregnancy: G1P0000 Patient Active Problem List   Diagnosis Date Noted   Biological false positive RPR test 05/02/2023   Supervision of high risk pregnancy, antepartum 04/10/2023   LGSIL on Pap smear of cervix 11/16/2021   Insulin resistance 09/14/2021   Vitamin D deficiency 09/14/2021   Postoperative hypothyroidism 08/24/2020   Papillary carcinoma of thyroid (HCC) 04/06/2017    Hypothyroid 04/06/2017   H/O total thyroidectomy 07/28/2016   Swollen lymph nodes 07/03/2016   PCOS (polycystic ovarian syndrome) 03/28/2016     Plan:  1. Supervision of high risk pregnancy, antepartum (Primary) New OB labs reviewed - Culture, OB Urine  2. Postoperative hypothyroidism Has endocrinology and had her TSH earlier this month.  3. Biological false positive RPR test Neg confirmatory tests.  4. Screening for cervical cancer Repeat pap - IGP, Aptima HPV, rfx 16/18,45  5. [redacted] weeks gestation of pregnancy    Initial labs drawn. Continue prenatal vitamins. Genetic Screening discussed, NIPS: ordered. Ultrasound discussed; fetal anatomic survey: ordered. Problem list reviewed and updated. The nature of [facility] [facility] with multiple MDs and other Advanced Practice Providers was explained to patient; also emphasized that residents, students are part of our team. Routine obstetric precautions reviewed. Return in 4 weeks (on 05/30/2023).

## 2023-05-02 NOTE — Progress Notes (Signed)
 NOB   OB labs drawn 03/11 Last pap: 09/14/2021 LSIL +HPV  Genetic Screening: Yes / pt states husband works at [employer]   CC: None

## 2023-05-03 DIAGNOSIS — E890 Postprocedural hypothyroidism: Secondary | ICD-10-CM | POA: Diagnosis not present

## 2023-05-04 LAB — CULTURE, OB URINE

## 2023-05-04 LAB — URINE CULTURE, OB REFLEX: Organism ID, Bacteria: NO GROWTH

## 2023-05-09 LAB — IGP, APTIMA HPV, RFX 16/18,45
HPV Aptima: NEGATIVE
PAP Smear Comment: 0

## 2023-05-10 ENCOUNTER — Encounter: Payer: Self-pay | Admitting: Family Medicine

## 2023-05-16 ENCOUNTER — Other Ambulatory Visit

## 2023-05-16 DIAGNOSIS — O0990 Supervision of high risk pregnancy, unspecified, unspecified trimester: Secondary | ICD-10-CM

## 2023-05-17 LAB — HEMOGLOBIN A1C
Est. average glucose Bld gHb Est-mCnc: 103 mg/dL
Hgb A1c MFr Bld: 5.2 % (ref 4.8–5.6)

## 2023-05-20 LAB — MATERNIT 21 PLUS CORE, BLOOD
Fetal Fraction: 15
Result (T21): NEGATIVE
Trisomy 13 (Patau syndrome): NEGATIVE
Trisomy 18 (Edwards syndrome): NEGATIVE
Trisomy 21 (Down syndrome): NEGATIVE

## 2023-05-21 ENCOUNTER — Encounter: Payer: Self-pay | Admitting: Family Medicine

## 2023-05-30 ENCOUNTER — Ambulatory Visit (INDEPENDENT_AMBULATORY_CARE_PROVIDER_SITE_OTHER): Admitting: Family Medicine

## 2023-05-30 VITALS — BP 124/82 | HR 86 | Wt 179.2 lb

## 2023-05-30 DIAGNOSIS — O0990 Supervision of high risk pregnancy, unspecified, unspecified trimester: Secondary | ICD-10-CM | POA: Diagnosis not present

## 2023-05-30 DIAGNOSIS — Z3A14 14 weeks gestation of pregnancy: Secondary | ICD-10-CM

## 2023-05-30 DIAGNOSIS — R768 Other specified abnormal immunological findings in serum: Secondary | ICD-10-CM

## 2023-05-30 DIAGNOSIS — E890 Postprocedural hypothyroidism: Secondary | ICD-10-CM

## 2023-05-30 NOTE — Progress Notes (Signed)
 ROB: Denies any concerns

## 2023-05-30 NOTE — Progress Notes (Signed)
   PRENATAL VISIT NOTE  Subjective:  Linda Edwards is a 38 y.o. G1P0000 at [redacted]w[redacted]d being seen today for ongoing prenatal care.  She is currently monitored for the following issues for this low-risk pregnancy and has PCOS (polycystic ovarian syndrome); Swollen lymph nodes; H/O total thyroidectomy; Papillary carcinoma of thyroid  (HCC); Hypothyroid; Insulin resistance; Postoperative hypothyroidism; Vitamin D  deficiency; Supervision of high risk pregnancy, antepartum; and Biological false positive RPR test on their problem list.  Patient reports no complaints.  Contractions: Not present. Vag. Bleeding: None.  Movement: Absent. Denies leaking of fluid.   The following portions of the patient's history were reviewed and updated as appropriate: allergies, current medications, past family history, past medical history, past social history, past surgical history and problem list.   Objective:   Vitals:   05/30/23 0930  BP: 124/82  Pulse: 86  Weight: 179 lb 3.2 oz (81.3 kg)    Fetal Status: Fetal Heart Rate (bpm): 153   Movement: Absent     General:  Alert, oriented and cooperative. Patient is in no acute distress.  Skin: Skin is warm and dry. No rash noted.   Cardiovascular: Normal heart rate noted  Respiratory: Normal respiratory effort, no problems with respiration noted  Abdomen: Soft, gravid, appropriate for gestational age.  Pain/Pressure: Absent     Pelvic: Cervical exam deferred        Extremities: Normal range of motion.  Edema: None  Mental Status: Normal mood and affect. Normal behavior. Normal judgment and thought content.   Assessment and Plan:  Pregnancy: G1P0000 at [redacted]w[redacted]d 1. Supervision of high risk pregnancy, antepartum (Primary) Carrier screening has not been done All other labs are WNL She has improved nausea - Cystic Fibrosis, 97 Variants - Hgb Fractionation Cascade - Spinal Muscular Atrophy (SMA)  2. Postoperative hypothyroidism Following with Endocrinology  3.  Biological false positive RPR test Neg confirmatory testing.  4. [redacted] weeks gestation of pregnancy   Preterm labor symptoms and general obstetric precautions including but not limited to vaginal bleeding, contractions, leaking of fluid and fetal movement were reviewed in detail with the patient. Please refer to After Visit Summary for other counseling recommendations.   Return in 4 weeks (on 06/27/2023).  Future Appointments  Date Time Provider Department Center  06/27/2023 10:55 AM Granville Layer, MD [facility]  07/03/2023  8:00 AM [facility] PROVIDER 1 [facility]  07/03/2023  8:30 AM [facility] US3 [facility]  07/24/2023  2:30 PM Granville Layer, MD [facility]    Granville Layer, MD

## 2023-05-31 DIAGNOSIS — E890 Postprocedural hypothyroidism: Secondary | ICD-10-CM | POA: Diagnosis not present

## 2023-06-11 LAB — SPINAL MUSCULAR ATROPHY (SMA)

## 2023-06-11 LAB — CYSTIC FIBROSIS, 97 VARIANTS

## 2023-06-11 LAB — HGB FRACTIONATION CASCADE
Hgb A2: 2.6 % (ref 1.8–3.2)
Hgb A: 97.4 % (ref 96.4–98.8)
Hgb F: 0 % (ref 0.0–2.0)
Hgb S: 0 %

## 2023-06-12 ENCOUNTER — Ambulatory Visit: Payer: Self-pay | Admitting: Family Medicine

## 2023-06-12 DIAGNOSIS — O0990 Supervision of high risk pregnancy, unspecified, unspecified trimester: Secondary | ICD-10-CM

## 2023-06-27 ENCOUNTER — Ambulatory Visit: Admitting: Family Medicine

## 2023-06-27 VITALS — BP 119/80 | HR 94 | Wt 179.4 lb

## 2023-06-27 DIAGNOSIS — O99210 Obesity complicating pregnancy, unspecified trimester: Secondary | ICD-10-CM | POA: Insufficient documentation

## 2023-06-27 DIAGNOSIS — E890 Postprocedural hypothyroidism: Secondary | ICD-10-CM

## 2023-06-27 DIAGNOSIS — O0990 Supervision of high risk pregnancy, unspecified, unspecified trimester: Secondary | ICD-10-CM

## 2023-06-27 DIAGNOSIS — Z3A18 18 weeks gestation of pregnancy: Secondary | ICD-10-CM

## 2023-06-27 NOTE — Progress Notes (Signed)
    PRENATAL VISIT NOTE  Subjective:  Linda Edwards is a 38 y.o. G1P0000 at [redacted]w[redacted]d being seen today for ongoing prenatal care.  She is currently monitored for the following issues for this high-risk pregnancy and has PCOS (polycystic ovarian syndrome); Swollen lymph nodes; H/O total thyroidectomy; Papillary carcinoma of thyroid  (HCC); Hypothyroid; Insulin resistance; Postoperative hypothyroidism; Vitamin D  deficiency; Supervision of high risk pregnancy, antepartum; and Biological false positive RPR test on their problem list.  Patient reports no complaints.  Contractions: Not present. Vag. Bleeding: None.  Movement: Absent. Denies leaking of fluid.   The following portions of the patient's history were reviewed and updated as appropriate: allergies, current medications, past family history, past medical history, past social history, past surgical history and problem list.   Objective:    Vitals:   06/27/23 1053  BP: 119/80  Pulse: 94  Weight: 179 lb 6.4 oz (81.4 kg)    Fetal Status:  Fetal Heart Rate (bpm): 148   Movement: Absent    General: Alert, oriented and cooperative. Patient is in no acute distress.  Skin: Skin is warm and dry. No rash noted.   Cardiovascular: Normal heart rate noted  Respiratory: Normal respiratory effort, no problems with respiration noted  Abdomen: Soft, gravid, appropriate for gestational age.  Pain/Pressure: Absent     Pelvic: Cervical exam deferred        Extremities: Normal range of motion.  Edema: None  Mental Status: Normal mood and affect. Normal behavior. Normal judgment and thought content.   Assessment and Plan:  Pregnancy: G1P0000 at [redacted]w[redacted]d 1. Postoperative hypothyroidism (Primary) Seeing endocrinology On repletion Weight is stable  2. Supervision of high risk pregnancy, antepartum For anatomy u/s next week  3. [redacted] weeks gestation of pregnancy   General obstetric precautions including but not limited to vaginal bleeding, contractions,  leaking of fluid and fetal movement were reviewed in detail with the patient. Please refer to After Visit Summary for other counseling recommendations.   Return in 4 weeks (on 07/25/2023).  Future Appointments  Date Time Provider Department Center  07/03/2023  8:00 AM [facility] PROVIDER 1 [facility]  07/03/2023  8:30 AM [facility] US3 [facility]  07/24/2023  2:30 PM Granville Layer, MD [facility]    Granville Layer, MD

## 2023-06-27 NOTE — Progress Notes (Signed)
 ROB: Denies any concerns

## 2023-06-29 ENCOUNTER — Ambulatory Visit

## 2023-07-03 ENCOUNTER — Ambulatory Visit: Payer: Self-pay | Admitting: Family Medicine

## 2023-07-03 ENCOUNTER — Ambulatory Visit: Admitting: Obstetrics and Gynecology

## 2023-07-03 ENCOUNTER — Ambulatory Visit: Attending: Family Medicine

## 2023-07-03 ENCOUNTER — Other Ambulatory Visit: Payer: Self-pay | Admitting: *Deleted

## 2023-07-03 VITALS — BP 120/66 | HR 78

## 2023-07-03 DIAGNOSIS — O99212 Obesity complicating pregnancy, second trimester: Secondary | ICD-10-CM

## 2023-07-03 DIAGNOSIS — O0990 Supervision of high risk pregnancy, unspecified, unspecified trimester: Secondary | ICD-10-CM | POA: Diagnosis not present

## 2023-07-03 DIAGNOSIS — Z9009 Acquired absence of other part of head and neck: Secondary | ICD-10-CM | POA: Diagnosis not present

## 2023-07-03 DIAGNOSIS — O99282 Endocrine, nutritional and metabolic diseases complicating pregnancy, second trimester: Secondary | ICD-10-CM

## 2023-07-03 DIAGNOSIS — E039 Hypothyroidism, unspecified: Secondary | ICD-10-CM

## 2023-07-03 DIAGNOSIS — R768 Other specified abnormal immunological findings in serum: Secondary | ICD-10-CM

## 2023-07-03 DIAGNOSIS — Z85850 Personal history of malignant neoplasm of thyroid: Secondary | ICD-10-CM

## 2023-07-03 DIAGNOSIS — O09512 Supervision of elderly primigravida, second trimester: Secondary | ICD-10-CM

## 2023-07-03 DIAGNOSIS — Z3A19 19 weeks gestation of pregnancy: Secondary | ICD-10-CM

## 2023-07-03 DIAGNOSIS — O43192 Other malformation of placenta, second trimester: Secondary | ICD-10-CM

## 2023-07-03 DIAGNOSIS — O43199 Other malformation of placenta, unspecified trimester: Secondary | ICD-10-CM | POA: Insufficient documentation

## 2023-07-03 DIAGNOSIS — O09522 Supervision of elderly multigravida, second trimester: Secondary | ICD-10-CM

## 2023-07-03 DIAGNOSIS — E669 Obesity, unspecified: Secondary | ICD-10-CM

## 2023-07-03 NOTE — Progress Notes (Signed)
 Maternal-Fetal Medicine Consultation Name: Linda Edwards MRN: [id number]  G1 P0 at 19w 1d gestation. Patient is here for fetal anatomy scan. On cell-free fetal DNA screening, the risks of aneuploidies are not increased.  In June 2018, she had right total thyroidectomy.  She had 2 thyroid  nodules and FNA C showed atypia with uncertain significance.  Postoperatively, pathology showed papillary and follicular carcinoma.  3 days later, she had left thyroidectomy.  Margin was free of cancer and no lymph nodes were involved.  Patient takes levothyroxine  137 micrograms daily and she does not have hypothyroidism.  No history of hypertension or diabetes.  We performed fetal anatomy scan. No makers of aneuploidies or fetal structural defects are seen. Fetal biometry is consistent with her previously-established dates. Amniotic fluid is normal and good fetal activity is seen.  Marginal cord insertion is seen. Patient understands the limitations of ultrasound in detecting fetal anomalies.   History of thyroid  cancer and thyroidectomy Patient had thyroidectomy 7 years ago and has not had recurrence.  I reassured the patient that pregnancy does not increase the risk of cancer.  I counseled the patient that it is important to treat hypothyroidism.  Overt hypothyroidism can lead to preeclampsia and fetal growth restriction.  Marginal cord insertion I explained the diagnosis with the help of ultrasound images and diagrams.  It can be associated with fetal growth restriction in some cases.  We recommend fetal growth assessment at 26- and 34-weeks' gestation.  Recommendations - An appointment was made for her to return in 5 weeks for completion of fetal anatomy (fetal spine). - Fetal growth assessments at 28 weeks and [redacted] weeks gestation.  Consultation including face-to-face (more than 50%) counseling 30 minutes.

## 2023-07-04 ENCOUNTER — Encounter: Admitting: Obstetrics and Gynecology

## 2023-07-06 DIAGNOSIS — E890 Postprocedural hypothyroidism: Secondary | ICD-10-CM | POA: Diagnosis not present

## 2023-07-13 DIAGNOSIS — C73 Malignant neoplasm of thyroid gland: Secondary | ICD-10-CM | POA: Diagnosis not present

## 2023-07-13 DIAGNOSIS — E890 Postprocedural hypothyroidism: Secondary | ICD-10-CM | POA: Diagnosis not present

## 2023-07-13 DIAGNOSIS — Z3490 Encounter for supervision of normal pregnancy, unspecified, unspecified trimester: Secondary | ICD-10-CM | POA: Diagnosis not present

## 2023-07-13 DIAGNOSIS — E282 Polycystic ovarian syndrome: Secondary | ICD-10-CM | POA: Diagnosis not present

## 2023-07-24 ENCOUNTER — Ambulatory Visit: Admitting: Family Medicine

## 2023-07-24 VITALS — BP 121/81 | HR 88 | Wt 187.4 lb

## 2023-07-24 DIAGNOSIS — O43199 Other malformation of placenta, unspecified trimester: Secondary | ICD-10-CM

## 2023-07-24 DIAGNOSIS — E890 Postprocedural hypothyroidism: Secondary | ICD-10-CM

## 2023-07-24 DIAGNOSIS — Z3A22 22 weeks gestation of pregnancy: Secondary | ICD-10-CM | POA: Diagnosis not present

## 2023-07-24 DIAGNOSIS — O0990 Supervision of high risk pregnancy, unspecified, unspecified trimester: Secondary | ICD-10-CM | POA: Diagnosis not present

## 2023-07-24 NOTE — Progress Notes (Signed)
    PRENATAL VISIT NOTE  Subjective:  Linda Edwards is a 38 y.o. G1P0000 at [redacted]w[redacted]d being seen today for ongoing prenatal care.  She is currently monitored for the following issues for this high-risk pregnancy and has PCOS (polycystic ovarian syndrome); H/O total thyroidectomy; Papillary carcinoma of thyroid  (HCC); Hypothyroid; Insulin resistance; Postoperative hypothyroidism; Supervision of high risk pregnancy, antepartum; Biological false positive RPR test; Obesity affecting pregnancy; and Marginal insertion of umbilical cord affecting management of mother on their problem list.  Patient reports no complaints.  Contractions: Not present. Vag. Bleeding: None.  Movement: Present. Denies leaking of fluid.   The following portions of the patient's history were reviewed and updated as appropriate: allergies, current medications, past family history, past medical history, past social history, past surgical history and problem list.   Objective:    Vitals:   07/24/23 1438  BP: 121/81  Pulse: 88  Weight: 187 lb 6.4 oz (85 kg)    Fetal Status:  Fetal Heart Rate (bpm): 146-151 Fundal Height: 24 cm Movement: Present    General: Alert, oriented and cooperative. Patient is in no acute distress.  Skin: Skin is warm and dry. No rash noted.   Cardiovascular: Normal heart rate noted  Respiratory: Normal respiratory effort, no problems with respiration noted  Abdomen: Soft, gravid, appropriate for gestational age.  Pain/Pressure: Absent     Pelvic: Cervical exam deferred        Extremities: Normal range of motion.  Edema: None  Mental Status: Normal mood and affect. Normal behavior. Normal judgment and thought content.   Assessment and Plan:  Pregnancy: G1P0000 at [redacted]w[redacted]d 1. Supervision of high risk pregnancy, antepartum (Primary) Continue routine prenatal care.  2. Marginal insertion of umbilical cord affecting management of mother Normal growth  3. [redacted] weeks gestation of pregnancy   4.  Postablative hypothyroidism See endocrinology  Preterm labor symptoms and general obstetric precautions including but not limited to vaginal bleeding, contractions, leaking of fluid and fetal movement were reviewed in detail with the patient. Please refer to After Visit Summary for other counseling recommendations.   No follow-ups on file.  Future Appointments  Date Time Provider Department Center  07/30/2023  9:30 AM Colette Torrence GRADE, MD [facility] None  08/07/2023  8:00 AM [facility] PROVIDER 1 [facility]  08/07/2023  8:30 AM [facility] US2 [facility]  08/22/2023  9:35 AM Letha Renshaw, CNM [facility]  09/06/2023  8:30 AM [facility] LAB [facility]  09/06/2023  8:55 AM Fredirick Glenys RAMAN, MD [facility]    Glenys RAMAN Fredirick, MD

## 2023-07-24 NOTE — Progress Notes (Signed)
 ROB: Ultrasound- discuss Marginal cord insertion/ anterior placenta    Will return for US  in 5 weeks

## 2023-07-30 ENCOUNTER — Ambulatory Visit: Admitting: Family Medicine

## 2023-07-30 ENCOUNTER — Encounter: Payer: Self-pay | Admitting: Family Medicine

## 2023-07-30 VITALS — BP 101/68 | HR 84 | Temp 98.2°F | Resp 18 | Ht 61.0 in | Wt 184.5 lb

## 2023-07-30 DIAGNOSIS — E890 Postprocedural hypothyroidism: Secondary | ICD-10-CM | POA: Diagnosis not present

## 2023-07-30 DIAGNOSIS — Z7689 Persons encountering health services in other specified circumstances: Secondary | ICD-10-CM | POA: Diagnosis not present

## 2023-07-30 NOTE — Progress Notes (Signed)
 New Patient Office Visit  Subjective    Patient ID: Linda Edwards, female    DOB: Jan 31, 1985  Age: 38 y.o. MRN: [id number]  CC:  Chief Complaint  Patient presents with   Establish Care    Patient is here to establish care with a new PCP, Patient would like to discuss her history of thyroid  history , back in 2018 patient states that she had thyroid  cancer and she is still being followed by a specialist. Patient is currently 6 months pregnant    HPI Graham Doukas presents to establish care. Pt is new to me.  She is currently 6 months pregnant. She has been diagnosed with thyroid  cancer in 2018 with surgery and treatment. She is on Synthroid  125 mcg daily. She reports she stayed tired and fatigued. She started working from home since 2020. She has a endocrinologist that she is seeing. Just saw her and had labs done including thyroid  check in May. Was told to follow up in Sept after she give birth. She is here asking about work schedule and to see if she can have this adjusted to help with her getting more sleep. She says she works 11-7 pm now and she is able to get enough sleep. Before she worked 2-11pm and would get to bed late. Her husband would be up at [facility] and she would be tired.    Outpatient Encounter Medications as of 07/30/2023  Medication Sig   Cholecalciferol (VITAMIN D ) 50 MCG (2000 UT) CAPS 1 capsule Orally Once a day   levothyroxine  (SYNTHROID ) 125 MCG tablet Take 125 mcg by mouth every morning.   Prenatal Vit-Fe Fumarate-FA (PRENATAL VITAMIN) 27-0.8 MG TABS Take 1 tablet by mouth daily.   [DISCONTINUED] levothyroxine  (SYNTHROID ) 137 MCG tablet Take 1 tablet (137 mcg total) by mouth daily before breakfast. (Patient not taking: Reported on 07/24/2023)   No facility-administered encounter medications on file as of 07/30/2023.    Past Medical History:  Diagnosis Date   Hypothyroidism    PCOS (polycystic ovarian syndrome)    Right thyroid  nodule 06/2016   Swollen lymph nodes  07/03/2016   Thyroid  carcinoma (HCC)    papillary thyroid  carcinoma and a follicular thyroid  carcinoma [facility] 07/28/2016)   Vitamin D  deficiency 09/14/2021    Past Surgical History:  Procedure Laterality Date   THYROID  LOBECTOMY Left 07/28/2016   THYROIDECTOMY Right 07/25/2016   Procedure: RIGHT HEMI THYROIDECTOMY;  Surgeon: Karis Clunes, MD;  Location: [facility];  Service: ENT;  Laterality: Right;   THYROIDECTOMY N/A 07/28/2016   Procedure: COMPLETION THYROIDECTOMY;  Surgeon: Karis Clunes, MD;  Location: [facility] OR;  Service: ENT;  Laterality: N/A;    Family History  Problem Relation Age of Onset   Lung cancer Mother    Cancer Mother    Lupus Sister    Diabetes Maternal Grandfather     Social History   Socioeconomic History   Marital status: Married    Spouse name: Not on file   Number of children: Not on file   Years of education: Not on file   Highest education level: Associate degree: academic program  Occupational History    Comment: works from home  Tobacco Use   Smoking status: Former    Current packs/day: 0.00    Types: Cigarettes    Start date: 01/29/2002    Quit date: 01/30/2004    Years since quitting: 19.5    Passive exposure: Past   Smokeless tobacco: Never  Vaping Use   Vaping  status: Never Used  Substance and Sexual Activity   Alcohol use: Not Currently    Comment: glass of wine/month, maybe   Drug use: No   Sexual activity: Yes    Birth control/protection: None    Comment: Pregnant  Other Topics Concern   Not on file  Social History Narrative   Not on file   Social Drivers of Health   Financial Resource Strain: Low Risk  (07/26/2023)   Overall Financial Resource Strain (CARDIA)    Difficulty of Paying Living Expenses: Not hard at all  Food Insecurity: No Food Insecurity (07/26/2023)   Hunger Vital Sign    Worried About Running Out of Food in the Last Year: Never true    Ran Out of Food in the Last Year: Never true  Transportation  Needs: No Transportation Needs (07/26/2023)   PRAPARE - Administrator, Civil Service (Medical): No    Lack of Transportation (Non-Medical): No  Physical Activity: Sufficiently Active (07/26/2023)   Exercise Vital Sign    Days of Exercise per Week: 5 days    Minutes of Exercise per Session: 30 min  Stress: No Stress Concern Present (07/26/2023)   [employer] of Occupational Health - Occupational Stress Questionnaire    Feeling of Stress: Only a little  Social Connections: Moderately Isolated (07/26/2023)   Social Connection and Isolation Panel    Frequency of Communication with Friends and Family: Three times a week    Frequency of Social Gatherings with Friends and Family: Once a week    Attends Religious Services: Never    Database administrator or Organizations: No    Attends Engineer, structural: Not on file    Marital Status: Married  Catering manager Violence: Not on file    Review of Systems  All other systems reviewed and are negative.      Objective    LMP 02/03/2023   Physical Exam Vitals and nursing note reviewed.  Constitutional:      Appearance: Normal appearance. She is normal weight.  HENT:     Head: Normocephalic and atraumatic.     Right Ear: External ear normal.     Left Ear: External ear normal.     Nose: Nose normal.     Mouth/Throat:     Mouth: Mucous membranes are moist.     Pharynx: Oropharynx is clear.   Eyes:     Conjunctiva/sclera: Conjunctivae normal.     Pupils: Pupils are equal, round, and reactive to light.    Cardiovascular:     Rate and Rhythm: Normal rate.  Pulmonary:     Effort: Pulmonary effort is normal.  Abdominal:     General: Bowel sounds are normal.     Comments: gravid   Skin:    General: Skin is warm.     Capillary Refill: Capillary refill takes less than 2 seconds.   Neurological:     General: No focal deficit present.     Mental Status: She is alert and oriented to person, place, and  time. Mental status is at baseline.   Psychiatric:        Mood and Affect: Mood normal.        Behavior: Behavior normal.        Thought Content: Thought content normal.        Judgment: Judgment normal.       Assessment & Plan:   Problem List Items Addressed This Visit   None Encounter to establish  care with new doctor  Postoperative hypothyroidism   Pt with hx of hypothyroidism. Asking about job shift and what would be best to eliminate fatigue? Have counseled pt on her hx of thyroid  disease being controlled at the time. She is pregnant and this alone can cause fatigue. Rest when she can and work for as long as she can. Due to pregnancy, have advised her to follow up with OB on work.   No follow-ups on file.   Torrence CINDERELLA Barrier, MD

## 2023-08-07 ENCOUNTER — Other Ambulatory Visit: Payer: Self-pay | Admitting: *Deleted

## 2023-08-07 ENCOUNTER — Ambulatory Visit: Attending: Obstetrics and Gynecology | Admitting: Obstetrics and Gynecology

## 2023-08-07 ENCOUNTER — Ambulatory Visit

## 2023-08-07 VITALS — BP 119/62

## 2023-08-07 DIAGNOSIS — E890 Postprocedural hypothyroidism: Secondary | ICD-10-CM | POA: Insufficient documentation

## 2023-08-07 DIAGNOSIS — O09522 Supervision of elderly multigravida, second trimester: Secondary | ICD-10-CM

## 2023-08-07 DIAGNOSIS — O43192 Other malformation of placenta, second trimester: Secondary | ICD-10-CM

## 2023-08-07 DIAGNOSIS — O99282 Endocrine, nutritional and metabolic diseases complicating pregnancy, second trimester: Secondary | ICD-10-CM | POA: Insufficient documentation

## 2023-08-07 DIAGNOSIS — Z3A24 24 weeks gestation of pregnancy: Secondary | ICD-10-CM | POA: Diagnosis not present

## 2023-08-07 DIAGNOSIS — O99212 Obesity complicating pregnancy, second trimester: Secondary | ICD-10-CM

## 2023-08-07 DIAGNOSIS — E039 Hypothyroidism, unspecified: Secondary | ICD-10-CM | POA: Diagnosis not present

## 2023-08-07 DIAGNOSIS — Z362 Encounter for other antenatal screening follow-up: Secondary | ICD-10-CM | POA: Insufficient documentation

## 2023-08-07 DIAGNOSIS — O0990 Supervision of high risk pregnancy, unspecified, unspecified trimester: Secondary | ICD-10-CM

## 2023-08-07 DIAGNOSIS — Z9089 Acquired absence of other organs: Secondary | ICD-10-CM

## 2023-08-07 DIAGNOSIS — O09512 Supervision of elderly primigravida, second trimester: Secondary | ICD-10-CM

## 2023-08-07 DIAGNOSIS — O43199 Other malformation of placenta, unspecified trimester: Secondary | ICD-10-CM

## 2023-08-07 DIAGNOSIS — R768 Other specified abnormal immunological findings in serum: Secondary | ICD-10-CM

## 2023-08-07 NOTE — Progress Notes (Signed)
 After review, MFM consult with provider is not indicated for today  Linda Ranks, MD 08/07/2023 9:34 AM  [facility]

## 2023-08-22 ENCOUNTER — Ambulatory Visit (INDEPENDENT_AMBULATORY_CARE_PROVIDER_SITE_OTHER): Admitting: Obstetrics and Gynecology

## 2023-08-22 VITALS — BP 110/74 | HR 98 | Wt 191.0 lb

## 2023-08-22 DIAGNOSIS — O0990 Supervision of high risk pregnancy, unspecified, unspecified trimester: Secondary | ICD-10-CM | POA: Diagnosis not present

## 2023-08-22 DIAGNOSIS — Z3A26 26 weeks gestation of pregnancy: Secondary | ICD-10-CM | POA: Diagnosis not present

## 2023-08-22 DIAGNOSIS — E890 Postprocedural hypothyroidism: Secondary | ICD-10-CM

## 2023-08-22 NOTE — Progress Notes (Signed)
 ROB  26w.2d  Pt had questions regarding GTT instructions and current medication  CC: None

## 2023-08-22 NOTE — Progress Notes (Signed)
  HIGH-RISK PREGNANCY OFFICE VISIT Patient name: Linda Edwards MRN [id number]  Date of birth: 04-11-1985 Chief Complaint:   Routine Prenatal Visit  History of Present Illness:   Linda Edwards is a 38 y.o. G57P0000 female at [redacted]w[redacted]d with an Estimated Date of Delivery: 11/26/23 being seen today for ongoing management of a high-risk pregnancy complicated by hypothyroidism Today she reports no complaints and wants to know if she can take Levothyroxine  the morning of her 2 hr GTT. Contractions: Not present. Vag. Bleeding: None.  Movement: Present. denies leaking of fluid.  Review of Systems:   Pertinent items are noted in HPI Denies abnormal vaginal discharge w/ itching/odor/irritation, headaches, visual changes, shortness of breath, chest pain, abdominal pain, severe nausea/vomiting, or problems with urination or bowel movements unless otherwise stated above. Pertinent History Reviewed:  Reviewed past medical,surgical, social, obstetrical and family history.  Reviewed problem list, medications and allergies. Physical Assessment:   Vitals:   08/22/23 0947  BP: 110/74  Pulse: 98  Weight: 191 lb (86.6 kg)  Body mass index is 36.09 kg/m.           Physical Examination:   General appearance: alert, well appearing, and in no distress, oriented to person, place, and time, and normal appearing weight  Mental status: alert, oriented to person, place, and time, normal mood, behavior, speech, dress, motor activity, and thought processes  Skin: warm & dry   Extremities: Edema: None    Cardiovascular: normal heart rate noted  Respiratory: normal respiratory effort, no distress  Abdomen: gravid, soft, non-tender  Pelvic: Cervical exam deferred         Fetal Status: Fetal Heart Rate (bpm): 141   Movement: Present    Fetal Surveillance Testing today: none   No results found for this or any previous visit (from the past 24 hours).  Assessment & Plan:  1) High-risk pregnancy G1P0000 at [redacted]w[redacted]d with an  Estimated Date of Delivery: 11/26/23   2) Supervision of high risk pregnancy, antepartum (Primary) - Doing well - Anticipatory guidance for 2 hr GTT - advised to fast after midnight without anything to eat or drink (except for water ), will have fasting blood drawn, drink the glucola drink (flavor choices: orange or fruit punch), have a visit with a provider during the first hour of testing, wait in the lab waiting room to have blood drawn at 1 hour and then 2 hours after finishing glucola drink.  3) Postoperative hypothyroidism - Advised ok to take Levothyroxine  the morning of 2hr GTT as long as she does not have to take it with food.  4) [redacted] weeks gestation of pregnancy    Meds: No orders of the defined types were placed in this encounter.   Labs/procedures today: none  Treatment Plan:  none  Reviewed: Preterm labor symptoms and general obstetric precautions including but not limited to vaginal bleeding, contractions, leaking of fluid and fetal movement were reviewed in detail with the patient.  All questions were answered. Has home bp cuff. Check bp weekly, let us  know if >140/90.   Follow-up: Return in about 2 weeks (around 09/05/2023) for Return OB 2hr GTT.  No orders of the defined types were placed in this encounter.  Marykate Heuberger MSN, CNM 08/22/2023 9:30 AM

## 2023-08-22 NOTE — Patient Instructions (Signed)
 Oral Glucose Tolerance Test During Pregnancy Why am I having this test? The oral glucose tolerance test (OGTT) is done to check how your body uses blood sugar, also called glucose. It's one of many tests used to diagnose the type of diabetes you can get while pregnant. This type of diabetes is called gestational diabetes mellitus (GDM). You may get GDM during the middle part of your pregnancy. In most cases, it goes away after you give birth. Most people get tested for GDM around weeks 24-28 of pregnancy. You may have the test sooner if: You or your mother had diabetes while pregnant. A person in your family has diabetes. You're having more than one baby this pregnancy. You've had a baby before who weighed more than 9 pounds (4 kg) at birth. You have high blood pressure or heart disease. You have a large body. You're not active. What is being tested? This test measures your blood sugar at different times. It shows how well your body uses the sugar in your blood. What kind of sample is taken?  A sample of blood is needed for this test. The sample is taken by putting a needle into a blood vessel. How do I prepare for this test? Eat your normal meals the day before the test. Your health care provider will tell you about: Eating or drinking on the day of the test. You may need to fast for 8-10 hours before the test. When you fast, you can only have water. Changing or stopping your regular medicines. Some medicines may affect your test results. Tell a health care provider about: All medicines you take. These include vitamins, herbs, eye drops, and creams. What happens during the test? The test involves these steps: Your blood sugar will be checked. It's called your fasting blood sugar if you fasted before the test. You'll drink a sugary mixture. Your blood sugar will be checked again. For a 1-hour test, it will be checked after an hour. For a 3-hour test, it will be checked 1, 2, and 3 hours  after you drink the sugary mixture. The test takes 1-3 hours. You'll need to stay at the testing place during this time. During the testing time: Do not eat or drink anything after the sugary drink. Do not exercise. Do not use any products that contain nicotine or tobacco. These products include cigarettes, chewing tobacco, and vaping devices, such as e-cigarettes. The test may vary among providers and hospitals. How are the results reported? Your provider will compare your results to normal values for the kind of test that you had done. You may need to call or meet with your provider to get your results. What do the results mean? Your provider can tell you what blood sugar levels are normal for the test you're doing. If two or more of your blood sugar levels are at or above normal, you may be diagnosed with GDM. If only one level is high, your provider may suggest: Doing the test again. Doing other tests to confirm a diagnosis. Talk with your provider about what your results mean. Questions to ask your health care provider Ask your provider, or the department doing the test: When will my results be ready? How will I get my results? What are my next steps? This information is not intended to replace advice given to you by your health care provider. Make sure you discuss any questions you have with your health care provider. Document Revised: 05/22/2022 Document Reviewed: 05/22/2022 Elsevier Patient Education  2024 Elsevier Inc.

## 2023-09-06 ENCOUNTER — Ambulatory Visit (INDEPENDENT_AMBULATORY_CARE_PROVIDER_SITE_OTHER): Admitting: Family Medicine

## 2023-09-06 ENCOUNTER — Other Ambulatory Visit

## 2023-09-06 VITALS — BP 109/74 | HR 86 | Wt 196.0 lb

## 2023-09-06 DIAGNOSIS — O43199 Other malformation of placenta, unspecified trimester: Secondary | ICD-10-CM

## 2023-09-06 DIAGNOSIS — E890 Postprocedural hypothyroidism: Secondary | ICD-10-CM | POA: Diagnosis not present

## 2023-09-06 DIAGNOSIS — O0990 Supervision of high risk pregnancy, unspecified, unspecified trimester: Secondary | ICD-10-CM | POA: Diagnosis not present

## 2023-09-06 DIAGNOSIS — Z3A28 28 weeks gestation of pregnancy: Secondary | ICD-10-CM | POA: Diagnosis not present

## 2023-09-06 NOTE — Progress Notes (Signed)
 ROB: Denies any concerns

## 2023-09-06 NOTE — Progress Notes (Signed)
    PRENATAL VISIT NOTE  Subjective:  Linda Edwards is a 38 y.o. G1P0000 at [redacted]w[redacted]d being seen today for ongoing prenatal care.  She is currently monitored for the following issues for this high-risk pregnancy and has PCOS (polycystic ovarian syndrome); H/O total thyroidectomy; Papillary carcinoma of thyroid  (HCC); Hypothyroid; Insulin resistance; Postoperative hypothyroidism; Supervision of high risk pregnancy, antepartum; Biological false positive RPR test; Obesity affecting pregnancy; and Marginal insertion of umbilical cord affecting management of mother on their problem list.  Patient reports no complaints.  Contractions: Not present. Vag. Bleeding: None.  Movement: Present. Denies leaking of fluid.   The following portions of the patient's history were reviewed and updated as appropriate: allergies, current medications, past family history, past medical history, past social history, past surgical history and problem list.   Objective:    Vitals:   09/06/23 0846  BP: 109/74  Pulse: 86  Weight: 196 lb (88.9 kg)    Fetal Status:  Fetal Heart Rate (bpm): 157-159 Fundal Height: 29 cm Movement: Present    General: Alert, oriented and cooperative. Patient is in no acute distress.  Skin: Skin is warm and dry. No rash noted.   Cardiovascular: Normal heart rate noted  Respiratory: Normal respiratory effort, no problems with respiration noted  Abdomen: Soft, gravid, appropriate for gestational age.  Pain/Pressure: Absent     Pelvic: Cervical exam deferred        Extremities: Normal range of motion.  Edema: None  Mental Status: Normal mood and affect. Normal behavior. Normal judgment and thought content.   Assessment and Plan:  Pregnancy: G1P0000 at [redacted]w[redacted]d 1. Supervision of high risk pregnancy, antepartum (Primary) Continue prenatal care. 28 week labs today Offer TDaP next visit  2. Postablative hypothyroidism Following with endo  3. Marginal insertion of umbilical cord affecting  management of mother F/u growth scheduled last EFW 81%  4. [redacted] weeks gestation of pregnancy   Preterm labor symptoms and general obstetric precautions including but not limited to vaginal bleeding, contractions, leaking of fluid and fetal movement were reviewed in detail with the patient. Please refer to After Visit Summary for other counseling recommendations.   Return in 2 weeks (on 09/20/2023).  Future Appointments  Date Time Provider Department Center  09/20/2023  9:55 AM Constant, Winton, MD [facility]  10/02/2023  9:00 AM [facility] PROVIDER 1 [facility]  10/02/2023  9:30 AM [facility] US5 [facility]  10/03/2023  8:35 AM Izell Harari, MD [facility]  10/18/2023 11:15 AM Fredirick Glenys RAMAN, MD [facility]    Glenys RAMAN Fredirick, MD

## 2023-09-06 NOTE — Patient Instructions (Signed)

## 2023-09-07 ENCOUNTER — Ambulatory Visit: Payer: Self-pay | Admitting: Family Medicine

## 2023-09-07 DIAGNOSIS — O0990 Supervision of high risk pregnancy, unspecified, unspecified trimester: Secondary | ICD-10-CM

## 2023-09-07 LAB — CBC
Hematocrit: 34 % (ref 34.0–46.6)
Hemoglobin: 10.6 g/dL — ABNORMAL LOW (ref 11.1–15.9)
MCH: 27.3 pg (ref 26.6–33.0)
MCHC: 31.2 g/dL — ABNORMAL LOW (ref 31.5–35.7)
MCV: 88 fL (ref 79–97)
Platelets: 356 x10E3/uL (ref 150–450)
RBC: 3.88 x10E6/uL (ref 3.77–5.28)
RDW: 13.8 % (ref 11.7–15.4)
WBC: 13 x10E3/uL — ABNORMAL HIGH (ref 3.4–10.8)

## 2023-09-07 LAB — GLUCOSE TOLERANCE, 2 HOURS W/ 1HR
Glucose, 1 hour: 160 mg/dL (ref 70–179)
Glucose, 2 hour: 134 mg/dL (ref 70–152)
Glucose, Fasting: 82 mg/dL (ref 70–91)

## 2023-09-07 LAB — HIV ANTIBODY (ROUTINE TESTING W REFLEX): HIV Screen 4th Generation wRfx: NONREACTIVE

## 2023-09-07 LAB — RPR: RPR Ser Ql: NONREACTIVE

## 2023-09-12 DIAGNOSIS — E890 Postprocedural hypothyroidism: Secondary | ICD-10-CM | POA: Diagnosis not present

## 2023-09-20 ENCOUNTER — Encounter: Payer: Self-pay | Admitting: Obstetrics and Gynecology

## 2023-09-20 ENCOUNTER — Ambulatory Visit (INDEPENDENT_AMBULATORY_CARE_PROVIDER_SITE_OTHER): Admitting: Obstetrics and Gynecology

## 2023-09-20 VITALS — BP 119/80 | HR 99 | Wt 194.0 lb

## 2023-09-20 DIAGNOSIS — O99213 Obesity complicating pregnancy, third trimester: Secondary | ICD-10-CM | POA: Diagnosis not present

## 2023-09-20 DIAGNOSIS — Z9889 Other specified postprocedural states: Secondary | ICD-10-CM

## 2023-09-20 DIAGNOSIS — O0990 Supervision of high risk pregnancy, unspecified, unspecified trimester: Secondary | ICD-10-CM

## 2023-09-20 DIAGNOSIS — Z9089 Acquired absence of other organs: Secondary | ICD-10-CM | POA: Diagnosis not present

## 2023-09-20 NOTE — Progress Notes (Signed)
   PRENATAL VISIT NOTE  Subjective:  Linda Edwards is a 38 y.o. G1P0000 at [redacted]w[redacted]d being seen today for ongoing prenatal care.  She is currently monitored for the following issues for this high-risk pregnancy and has PCOS (polycystic ovarian syndrome); H/O total thyroidectomy; Papillary carcinoma of thyroid  (HCC); Hypothyroid; Insulin resistance; Postoperative hypothyroidism; Supervision of high risk pregnancy, antepartum; Biological false positive RPR test; Obesity affecting pregnancy; and Marginal insertion of umbilical cord affecting management of mother on their problem list.  Patient reports no complaints.  Contractions: Not present. Vag. Bleeding: None.  Movement: Present. Denies leaking of fluid.   The following portions of the patient's history were reviewed and updated as appropriate: allergies, current medications, past family history, past medical history, past social history, past surgical history and problem list.   Objective:    Vitals:   09/20/23 0952  BP: 119/80  Pulse: 99  Weight: 194 lb (88 kg)    Fetal Status:  Fetal Heart Rate (bpm): 151 Fundal Height: 31 cm Movement: Present    General: Alert, oriented and cooperative. Patient is in no acute distress.  Skin: Skin is warm and dry. No rash noted.   Cardiovascular: Normal heart rate noted  Respiratory: Normal respiratory effort, no problems with respiration noted  Abdomen: Soft, gravid, appropriate for gestational age.  Pain/Pressure: Absent     Pelvic: Cervical exam deferred        Extremities: Normal range of motion.  Edema: None  Mental Status: Normal mood and affect. Normal behavior. Normal judgment and thought content.   Assessment and Plan:  Pregnancy: G1P0000 at [redacted]w[redacted]d 1. Supervision of high risk pregnancy, antepartum (Primary) Patient is doing well without complaints Undecided on pediatrician Patient will return for tdap  2. Obesity affecting pregnancy in third trimester, unspecified obesity type   3.  H/O total thyroidectomy Synthroid  dose adjusted by endocrinologist on 09/12/23 with follow up plans in 3 weeks  Preterm labor symptoms and general obstetric precautions including but not limited to vaginal bleeding, contractions, leaking of fluid and fetal movement were reviewed in detail with the patient. Please refer to After Visit Summary for other counseling recommendations.   Return in about 2 weeks (around 10/04/2023) for in person, ROB, Low risk.  Future Appointments  Date Time Provider Department Center  10/02/2023  9:00 AM [facility] PROVIDER 1 [facility]  10/02/2023  9:30 AM [facility] US5 [facility]  10/03/2023  8:35 AM Izell Harari, MD [facility]  10/18/2023 11:15 AM Fredirick Glenys RAMAN, MD [facility]    Winton Felt, MD

## 2023-09-20 NOTE — Progress Notes (Signed)
 ROB: denies any concerns

## 2023-10-02 ENCOUNTER — Ambulatory Visit: Attending: Obstetrics and Gynecology | Admitting: Obstetrics and Gynecology

## 2023-10-02 ENCOUNTER — Other Ambulatory Visit: Payer: Self-pay | Admitting: *Deleted

## 2023-10-02 ENCOUNTER — Ambulatory Visit (HOSPITAL_BASED_OUTPATIENT_CLINIC_OR_DEPARTMENT_OTHER)

## 2023-10-02 DIAGNOSIS — O3663X0 Maternal care for excessive fetal growth, third trimester, not applicable or unspecified: Secondary | ICD-10-CM

## 2023-10-02 DIAGNOSIS — E039 Hypothyroidism, unspecified: Secondary | ICD-10-CM

## 2023-10-02 DIAGNOSIS — O43193 Other malformation of placenta, third trimester: Secondary | ICD-10-CM | POA: Diagnosis not present

## 2023-10-02 DIAGNOSIS — Z85850 Personal history of malignant neoplasm of thyroid: Secondary | ICD-10-CM | POA: Insufficient documentation

## 2023-10-02 DIAGNOSIS — E890 Postprocedural hypothyroidism: Secondary | ICD-10-CM | POA: Diagnosis not present

## 2023-10-02 DIAGNOSIS — O99283 Endocrine, nutritional and metabolic diseases complicating pregnancy, third trimester: Secondary | ICD-10-CM

## 2023-10-02 DIAGNOSIS — O09513 Supervision of elderly primigravida, third trimester: Secondary | ICD-10-CM | POA: Diagnosis not present

## 2023-10-02 DIAGNOSIS — Z79890 Hormone replacement therapy (postmenopausal): Secondary | ICD-10-CM | POA: Diagnosis not present

## 2023-10-02 DIAGNOSIS — R768 Other specified abnormal immunological findings in serum: Secondary | ICD-10-CM

## 2023-10-02 DIAGNOSIS — Z3A32 32 weeks gestation of pregnancy: Secondary | ICD-10-CM | POA: Insufficient documentation

## 2023-10-02 DIAGNOSIS — O43192 Other malformation of placenta, second trimester: Secondary | ICD-10-CM

## 2023-10-02 DIAGNOSIS — O0990 Supervision of high risk pregnancy, unspecified, unspecified trimester: Secondary | ICD-10-CM

## 2023-10-02 DIAGNOSIS — O99213 Obesity complicating pregnancy, third trimester: Secondary | ICD-10-CM

## 2023-10-02 DIAGNOSIS — O09523 Supervision of elderly multigravida, third trimester: Secondary | ICD-10-CM

## 2023-10-02 DIAGNOSIS — O99212 Obesity complicating pregnancy, second trimester: Secondary | ICD-10-CM

## 2023-10-02 DIAGNOSIS — Z9089 Acquired absence of other organs: Secondary | ICD-10-CM

## 2023-10-02 DIAGNOSIS — E669 Obesity, unspecified: Secondary | ICD-10-CM

## 2023-10-02 DIAGNOSIS — O09522 Supervision of elderly multigravida, second trimester: Secondary | ICD-10-CM

## 2023-10-02 DIAGNOSIS — O43199 Other malformation of placenta, unspecified trimester: Secondary | ICD-10-CM

## 2023-10-02 DIAGNOSIS — O358XX0 Maternal care for other (suspected) fetal abnormality and damage, not applicable or unspecified: Secondary | ICD-10-CM

## 2023-10-02 NOTE — Progress Notes (Signed)
 Maternal-Fetal Medicine Consultation Name: Linda Edwards MRN: [id number]  G1 P0 at 32w 1d gestation.  Patient is here for fetal growth assessment. -Hypothyroidism.  Patient had recent consultation with her endocrinologist who has increased her levothyroxine  dose to 150 micrograms daily. - Advanced maternal age.  On cell free fetal DNA screening, the risks of fetal aneuploidy's are not increased. - Marginal cord insertion.  Ultrasound On today's ultrasound, the estimated fetal weight is at the 92nd percentile and the abdominal circumference measurement at the 99th percentile.  Amniotic fluid is normal and good fetal activity seen.  Marginal cord insertion is seen again.  Estimation of fetal weight I counseled the couple that ultrasound has limitations in accurately estimating fetal weights and there is a margin of error up to 15%.  Induction of labor before [redacted] weeks gestation is not indicated for suspected fetal macrosomia. In the absence of gestational diabetes, the likelihood of shoulder dystocia is low. I recommended a follow-up fetal growth assessment and estimation of fetal weight in 6 weeks.  I encouraged the patient to take levothyroxine  doses regularly.  Hypothyroidism can be associated with increased likelihood of preeclampsia.  Patient is not taking low-dose aspirin.  Recommendations -An appointment was made for her to return in 6 weeks for fetal growth assessment and estimation of fetal weight.  Consultation including face-to-face (more than 50%) counseling 20 minutes.

## 2023-10-03 ENCOUNTER — Encounter: Payer: Self-pay | Admitting: Obstetrics and Gynecology

## 2023-10-03 ENCOUNTER — Ambulatory Visit (INDEPENDENT_AMBULATORY_CARE_PROVIDER_SITE_OTHER): Admitting: Obstetrics and Gynecology

## 2023-10-03 VITALS — BP 120/79 | HR 91 | Wt 197.0 lb

## 2023-10-03 DIAGNOSIS — O3663X0 Maternal care for excessive fetal growth, third trimester, not applicable or unspecified: Secondary | ICD-10-CM | POA: Insufficient documentation

## 2023-10-03 DIAGNOSIS — O0990 Supervision of high risk pregnancy, unspecified, unspecified trimester: Secondary | ICD-10-CM

## 2023-10-03 DIAGNOSIS — O43199 Other malformation of placenta, unspecified trimester: Secondary | ICD-10-CM

## 2023-10-03 DIAGNOSIS — O99213 Obesity complicating pregnancy, third trimester: Secondary | ICD-10-CM | POA: Diagnosis not present

## 2023-10-03 DIAGNOSIS — Z9089 Acquired absence of other organs: Secondary | ICD-10-CM

## 2023-10-03 DIAGNOSIS — Z9889 Other specified postprocedural states: Secondary | ICD-10-CM

## 2023-10-03 DIAGNOSIS — E890 Postprocedural hypothyroidism: Secondary | ICD-10-CM | POA: Diagnosis not present

## 2023-10-03 DIAGNOSIS — O09513 Supervision of elderly primigravida, third trimester: Secondary | ICD-10-CM | POA: Insufficient documentation

## 2023-10-03 NOTE — Progress Notes (Signed)
 ROB   CC: None

## 2023-10-03 NOTE — Progress Notes (Signed)
   PRENATAL VISIT NOTE  Subjective:  Linda Edwards is a 38 y.o. G1P0000 at [redacted]w[redacted]d being seen today for ongoing prenatal care.  She is currently monitored for the following issues for this low-risk pregnancy and has H/O total thyroidectomy; Papillary carcinoma of thyroid  (HCC); Hypothyroid; Insulin resistance; Postoperative hypothyroidism; Supervision of high risk pregnancy, antepartum; Obesity affecting pregnancy; Marginal insertion of umbilical cord affecting management of mother; and Excessive fetal growth affecting management of mother in third trimester, antepartum on their problem list.  Patient reports no complaints.  Contractions: Not present. Vag. Bleeding: None.  Movement: Present. Denies leaking of fluid.   The following portions of the patient's history were reviewed and updated as appropriate: allergies, current medications, past family history, past medical history, past social history, past surgical history and problem list.   Objective:    Vitals:   10/03/23 0841  BP: 120/79  Pulse: 91  Weight: 197 lb (89.4 kg)    Fetal Status:  Fetal Heart Rate (bpm): 138   Movement: Present    General: Alert, oriented and cooperative. Patient is in no acute distress.  Skin: Skin is warm and dry. No rash noted.   Cardiovascular: Normal heart rate noted  Respiratory: Normal respiratory effort, no problems with respiration noted  Abdomen: Soft, gravid, appropriate for gestational age.  Pain/Pressure: Absent     Pelvic: Cervical exam deferred        Extremities: Normal range of motion.  Edema: None  Mental Status: Normal mood and affect. Normal behavior. Normal judgment and thought content.   Assessment and Plan:  Pregnancy: G1P0000 at [redacted]w[redacted]d 1. Excessive fetal growth affecting management of pregnancy in third trimester, single or unspecified fetus (Primary) Diagnosed yesterday: 9/3: 2332g, 92% (vs 85% prior), ac 99%, afi 13.9 S/p normal 2h GTT Follow up repeat. D/w her that they may  recommend 39wk IOL if still big, and also because of marginal cord insertion and being AMA  2. Postablative hypothyroidism Followed by endo and recently up to 150 of synthroid . I asked to her asked her endo doctor, when she's closer to her [facility], what they recommend for her dosing after delivery; she was on 125 pre pregnancy  3. Supervision of high risk pregnancy, antepartum  4. Obesity affecting pregnancy in third trimester, unspecified obesity type Weight good, 23lbs total weight gain  5. Marginal insertion of umbilical cord affecting management of mother  6. AMA  Preterm labor symptoms and general obstetric precautions including but not limited to vaginal bleeding, contractions, leaking of fluid and fetal movement were reviewed in detail with the patient. Please refer to After Visit Summary for other counseling recommendations.   Return in about 2 weeks (around 10/17/2023) for low risk ob, in person, md or app.  Future Appointments  Date Time Provider Department Center  10/08/2023  8:45 AM [facility] NURSE [facility]  10/18/2023 11:15 AM Fredirick Glenys RAMAN, MD [facility]  11/01/2023  8:55 AM Fredirick Glenys RAMAN, MD [facility]  11/08/2023  8:55 AM Herchel Gloris LABOR, MD [facility]  11/13/2023  8:15 AM [facility] PROVIDER 1 [facility]  11/13/2023  8:30 AM [facility] US4 [facility]  11/15/2023  8:55 AM Izell Harari, MD [facility]  11/22/2023  8:55 AM Fredirick Glenys RAMAN, MD [facility]  11/29/2023  8:55 AM Izell Harari, MD [facility]    Harari Izell, MD

## 2023-10-08 ENCOUNTER — Ambulatory Visit (INDEPENDENT_AMBULATORY_CARE_PROVIDER_SITE_OTHER)

## 2023-10-08 DIAGNOSIS — Z23 Encounter for immunization: Secondary | ICD-10-CM | POA: Diagnosis not present

## 2023-10-08 NOTE — Progress Notes (Signed)
 OB pt here for Vaccine only /T-DAP  T-dap given without any difficulties.

## 2023-10-09 DIAGNOSIS — E890 Postprocedural hypothyroidism: Secondary | ICD-10-CM | POA: Diagnosis not present

## 2023-10-18 ENCOUNTER — Ambulatory Visit: Admitting: Family Medicine

## 2023-10-18 VITALS — BP 124/84 | HR 105 | Wt 199.4 lb

## 2023-10-18 DIAGNOSIS — Z3A34 34 weeks gestation of pregnancy: Secondary | ICD-10-CM

## 2023-10-18 DIAGNOSIS — O3663X0 Maternal care for excessive fetal growth, third trimester, not applicable or unspecified: Secondary | ICD-10-CM | POA: Diagnosis not present

## 2023-10-18 DIAGNOSIS — O09513 Supervision of elderly primigravida, third trimester: Secondary | ICD-10-CM

## 2023-10-18 DIAGNOSIS — O0990 Supervision of high risk pregnancy, unspecified, unspecified trimester: Secondary | ICD-10-CM

## 2023-10-18 DIAGNOSIS — Z9889 Other specified postprocedural states: Secondary | ICD-10-CM

## 2023-10-18 DIAGNOSIS — O43199 Other malformation of placenta, unspecified trimester: Secondary | ICD-10-CM

## 2023-10-18 DIAGNOSIS — Z9089 Acquired absence of other organs: Secondary | ICD-10-CM

## 2023-10-18 NOTE — Progress Notes (Signed)
   PRENATAL VISIT NOTE  Subjective:  Linda Edwards is a 38 y.o. G1P0000 at [redacted]w[redacted]d being seen today for ongoing prenatal care.  She is currently monitored for the following issues for this high-risk pregnancy and has H/O total thyroidectomy; Papillary carcinoma of thyroid  (HCC); Hypothyroid; Insulin resistance; Postoperative hypothyroidism; Supervision of high risk pregnancy, antepartum; Obesity affecting pregnancy; Marginal insertion of umbilical cord affecting management of mother; Excessive fetal growth affecting management of mother in third trimester, antepartum; and AMA (advanced maternal age) primigravida 35+, third trimester on their problem list.  Patient reports no complaints.  Contractions: Not present. Vag. Bleeding: None.  Movement: Present. Denies leaking of fluid.   The following portions of the patient's history were reviewed and updated as appropriate: allergies, current medications, past family history, past medical history, past social history, past surgical history and problem list.   Objective:    Vitals:   10/18/23 1123  BP: 124/84  Pulse: (!) 105  Weight: 199 lb 6.4 oz (90.4 kg)    Fetal Status:  Fetal Heart Rate (bpm): 160 Fundal Height: 39 cm Movement: Present    General: Alert, oriented and cooperative. Patient is in no acute distress.  Skin: Skin is warm and dry. No rash noted.   Cardiovascular: Normal heart rate noted  Respiratory: Normal respiratory effort, no problems with respiration noted  Abdomen: Soft, gravid, appropriate for gestational age.  Pain/Pressure: Absent     Pelvic: Cervical exam deferred        Extremities: Normal range of motion.  Edema: None  Mental Status: Normal mood and affect. Normal behavior. Normal judgment and thought content.   Assessment and Plan:  Pregnancy: G1P0000 at [redacted]w[redacted]d 1. Supervision of high risk pregnancy, antepartum (Primary) Continue prenatal care.  2. Marginal insertion of umbilical cord affecting management of  mother EFW 92%  3. Excessive fetal growth affecting management of pregnancy in third trimester, single or unspecified fetus Discussed IOL @ 39 weeks if indicated F/u growth scheduled at 38 weeks  4. AMA (advanced maternal age) primigravida 35+, third trimester LR NIPT  5. H/O total thyroidectomy On 175 mcg at current  On 125 mcg prior to pregnancy  6. [redacted] weeks gestation of pregnancy   Preterm labor symptoms and general obstetric precautions including but not limited to vaginal bleeding, contractions, leaking of fluid and fetal movement were reviewed in detail with the patient. Please refer to After Visit Summary for other counseling recommendations.   Return in 2 weeks (on 11/01/2023).  Future Appointments  Date Time Provider Department Center  11/01/2023  8:55 AM Fredirick Glenys RAMAN, MD [facility]  11/08/2023  8:55 AM Herchel Gloris LABOR, MD [facility]  11/13/2023  8:15 AM [facility] PROVIDER 1 [facility]  11/13/2023  8:30 AM [facility] US4 [facility]  11/15/2023  8:55 AM Izell Harari, MD [facility]  11/22/2023  8:55 AM Fredirick Glenys RAMAN, MD [facility]  11/29/2023  8:55 AM Izell Harari, MD [facility]    Glenys RAMAN Fredirick, MD

## 2023-10-18 NOTE — Progress Notes (Signed)
 Pt presents for  rob. Pt declines flu shot. No questions or concerns at this time.

## 2023-10-22 ENCOUNTER — Encounter: Payer: Self-pay | Admitting: *Deleted

## 2023-11-01 ENCOUNTER — Other Ambulatory Visit (HOSPITAL_COMMUNITY)
Admission: RE | Admit: 2023-11-01 | Discharge: 2023-11-01 | Disposition: A | Source: Ambulatory Visit | Attending: Family Medicine | Admitting: Family Medicine

## 2023-11-01 ENCOUNTER — Ambulatory Visit (INDEPENDENT_AMBULATORY_CARE_PROVIDER_SITE_OTHER): Admitting: Family Medicine

## 2023-11-01 VITALS — BP 124/84 | HR 87 | Wt 202.0 lb

## 2023-11-01 DIAGNOSIS — O43199 Other malformation of placenta, unspecified trimester: Secondary | ICD-10-CM | POA: Diagnosis not present

## 2023-11-01 DIAGNOSIS — O0990 Supervision of high risk pregnancy, unspecified, unspecified trimester: Secondary | ICD-10-CM | POA: Diagnosis not present

## 2023-11-01 DIAGNOSIS — O09513 Supervision of elderly primigravida, third trimester: Secondary | ICD-10-CM | POA: Diagnosis not present

## 2023-11-01 DIAGNOSIS — O3663X0 Maternal care for excessive fetal growth, third trimester, not applicable or unspecified: Secondary | ICD-10-CM

## 2023-11-01 DIAGNOSIS — E890 Postprocedural hypothyroidism: Secondary | ICD-10-CM | POA: Diagnosis not present

## 2023-11-01 DIAGNOSIS — Z3A36 36 weeks gestation of pregnancy: Secondary | ICD-10-CM

## 2023-11-01 NOTE — Progress Notes (Signed)
   PRENATAL VISIT NOTE  Subjective:  Linda Edwards is a 38 y.o. G1P0000 at [redacted]w[redacted]d being seen today for ongoing prenatal care.  She is currently monitored for the following issues for this high-risk pregnancy and has H/O total thyroidectomy; Papillary carcinoma of thyroid  (HCC); Hypothyroid; Insulin  resistance; Postoperative hypothyroidism; Supervision of high risk pregnancy, antepartum; Obesity affecting pregnancy; Marginal insertion of umbilical cord affecting management of mother; Excessive fetal growth affecting management of mother in third trimester, antepartum; and AMA (advanced maternal age) primigravida 35+, third trimester on their problem list.  Patient reports no complaints.  Contractions: Not present. Vag. Bleeding: None.  Movement: Present. Denies leaking of fluid.   The following portions of the patient's history were reviewed and updated as appropriate: allergies, current medications, past family history, past medical history, past social history, past surgical history and problem list.   Objective:    Vitals:   11/01/23 0900  BP: 124/84  Pulse: 87  Weight: 202 lb (91.6 kg)    Fetal Status:  Fetal Heart Rate (bpm): 153 Fundal Height: 36 cm Movement: Present Presentation: Vertex  General: Alert, oriented and cooperative. Patient is in no acute distress.  Skin: Skin is warm and dry. No rash noted.   Cardiovascular: Normal heart rate noted  Respiratory: Normal respiratory effort, no problems with respiration noted  Abdomen: Soft, gravid, appropriate for gestational age.  Pain/Pressure: Absent     Pelvic: Cervical exam deferred        Extremities: Normal range of motion.     Mental Status: Normal mood and affect. Normal behavior. Normal judgment and thought content.   Assessment and Plan:  Pregnancy: G1P0000 at [redacted]w[redacted]d 1. Postablative hypothyroidism (Primary) Followed by Endocrinology  2. Supervision of high risk pregnancy, antepartum Cultures today - Strep Gp B NAA -  Cervicovaginal ancillary only  3. Marginal insertion of umbilical cord affecting management of mother Has normal growth  4. AMA (advanced maternal age) primigravida 35+, third trimester LR NIPT  5. Excessive fetal growth affecting management of pregnancy in third trimester, single or unspecified fetus LGA--to determine IOL plans after next u/s  6. [redacted] weeks gestation of pregnancy   Preterm labor symptoms and general obstetric precautions including but not limited to vaginal bleeding, contractions, leaking of fluid and fetal movement were reviewed in detail with the patient. Please refer to After Visit Summary for other counseling recommendations.   Return in 1 week (on 11/08/2023).  Future Appointments  Date Time Provider Department Center  11/08/2023  8:55 AM Herchel, Gloris LABOR, MD [facility]  11/13/2023  8:15 AM [facility] PROVIDER 1 [facility]  11/13/2023  8:30 AM [facility] US4 [facility]  11/15/2023  8:55 AM Izell Harari, MD [facility]  11/22/2023  8:55 AM Fredirick Glenys RAMAN, MD [facility]  11/29/2023  8:55 AM Izell Harari, MD [facility]    Glenys RAMAN Fredirick, MD

## 2023-11-01 NOTE — Progress Notes (Signed)
 Error

## 2023-11-02 LAB — CERVICOVAGINAL ANCILLARY ONLY
Chlamydia: NEGATIVE
Comment: NEGATIVE
Comment: NORMAL
Neisseria Gonorrhea: NEGATIVE

## 2023-11-03 ENCOUNTER — Ambulatory Visit: Payer: Self-pay | Admitting: Family Medicine

## 2023-11-03 DIAGNOSIS — O0990 Supervision of high risk pregnancy, unspecified, unspecified trimester: Secondary | ICD-10-CM

## 2023-11-03 LAB — STREP GP B NAA: Strep Gp B NAA: NEGATIVE

## 2023-11-06 DIAGNOSIS — E890 Postprocedural hypothyroidism: Secondary | ICD-10-CM | POA: Diagnosis not present

## 2023-11-08 ENCOUNTER — Ambulatory Visit (INDEPENDENT_AMBULATORY_CARE_PROVIDER_SITE_OTHER): Admitting: Obstetrics & Gynecology

## 2023-11-08 ENCOUNTER — Encounter: Payer: Self-pay | Admitting: Obstetrics & Gynecology

## 2023-11-08 VITALS — BP 118/82 | HR 78 | Wt 204.0 lb

## 2023-11-08 DIAGNOSIS — E890 Postprocedural hypothyroidism: Secondary | ICD-10-CM | POA: Diagnosis not present

## 2023-11-08 DIAGNOSIS — O09513 Supervision of elderly primigravida, third trimester: Secondary | ICD-10-CM

## 2023-11-08 DIAGNOSIS — O3663X0 Maternal care for excessive fetal growth, third trimester, not applicable or unspecified: Secondary | ICD-10-CM

## 2023-11-08 DIAGNOSIS — O43199 Other malformation of placenta, unspecified trimester: Secondary | ICD-10-CM | POA: Diagnosis not present

## 2023-11-08 DIAGNOSIS — Z3A37 37 weeks gestation of pregnancy: Secondary | ICD-10-CM

## 2023-11-08 DIAGNOSIS — O0990 Supervision of high risk pregnancy, unspecified, unspecified trimester: Secondary | ICD-10-CM

## 2023-11-08 NOTE — Patient Instructions (Signed)

## 2023-11-08 NOTE — Progress Notes (Signed)
 PRENATAL VISIT NOTE  Subjective:  Linda Edwards is a 38 y.o. G1P0000 at [redacted]w[redacted]d being seen today for ongoing prenatal care.  She is currently monitored for the following issues for this high-risk pregnancy and has H/O total thyroidectomy; Papillary carcinoma of thyroid  (HCC); Hypothyroid; Insulin  resistance; Postoperative hypothyroidism; Supervision of high risk pregnancy, antepartum; Obesity affecting pregnancy; Marginal insertion of umbilical cord affecting management of mother; Excessive fetal growth affecting management of mother in third trimester, antepartum; and AMA (advanced maternal age) primigravida 35+, third trimester on their problem list.  Patient reports no complaints.  Contractions: Not present. Vag. Bleeding: None.  Movement: Present. Denies leaking of fluid.   The following portions of the patient's history were reviewed and updated as appropriate: allergies, current medications, past family history, past medical history, past social history, past surgical history and problem list.   Objective:    Vitals:   11/08/23 0856  BP: 118/82  Pulse: 78  Weight: 204 lb (92.5 kg)    Fetal Status:  Fetal Heart Rate (bpm): 145   Movement: Present    General: Alert, oriented and cooperative. Patient is in no acute distress.  Skin: Skin is warm and dry. No rash noted.   Cardiovascular: Normal heart rate noted  Respiratory: Normal respiratory effort, no problems with respiration noted  Abdomen: Soft, gravid, appropriate for gestational age.  Pain/Pressure: Absent     Pelvic: Cervical exam deferred        Extremities: Normal range of motion.     Mental Status: Normal mood and affect. Normal behavior. Normal judgment and thought content.   US  MFM OB FOLLOW UP Result Date: 10/02/2023 ----------------------------------------------------------------------  OBSTETRICS REPORT                       (Signed Final 10/02/2023 10:55 am)  ---------------------------------------------------------------------- Patient Info  ID #:       [id number]                          D.O.B.:  1985-03-16 (38 yrs)(F)  Name:       Linda Edwards                   Visit Date: 10/02/2023 09:33 am ---------------------------------------------------------------------- Performed By  Attending:        Fredia Fresh MD        Ref. Address:     [address]                                                             [address]  Performed By:     Erminio Gentry            [facility]  Referred By:      [facility] Correne Beagle ---------------------------------------------------------------------- Orders  #  Description                           Code        Ordered By  1  US  MFM OB FOLLOW UP                   [id number]    FREDIA FRESH ----------------------------------------------------------------------  #  Order #                     Accession #                Episode #  1  [id number]                   [id number]                 [id number] ---------------------------------------------------------------------- Indications  Advanced maternal age multigravida 47+,        O70.523  third trimester (Age 16)  Marginal insertion of umbilical cord affecting O43.193  management of mother in third trimester  Hypothyroid (Hx of thyroid  cancer and total    O99.280 E03.9  thyroidectomy; on levothyroxine )  Obesity complicating pregnancy, third          O99.213  trimester  [redacted] weeks gestation of pregnancy                Z3A.32  LR MaterniT21 - Female, Low risk  CF/SMA/Beta Thalassemia ---------------------------------------------------------------------- Fetal Evaluation  Num Of Fetuses:         1  Fetal Heart Rate(bpm):  140  Cardiac Activity:       Observed  Presentation:            Cephalic  Placenta:               Anterior  P. Cord Insertion:      Marginal insertion  Amniotic Fluid  AFI FV:      Within normal limits  AFI Sum(cm)     %Tile       Largest Pocket(cm)  13.95           47          4.98  RUQ(cm)       RLQ(cm)       LUQ(cm)        LLQ(cm)  2.78          3.01          4.98           3.18 ---------------------------------------------------------------------- Biometry  BPD:      85.1  mm     G. Age:  34w 2d         93  %    CI:        75.74   %    70 - 86  FL/HC:      19.6   %    19.1 - 21.3  HC:       310   mm     G. Age:  34w 4d         79  %    HC/AC:      0.99        0.96 - 1.17  AC:      311.7  mm     G. Age:  35w 1d         99  %    FL/BPD:     71.4   %    71 - 87  FL:       60.8  mm     G. Age:  31w 4d         23  %    FL/AC:      19.5   %    20 - 24  HUM:      55.2  mm     G. Age:  32w 1d         51  %  LV:        5.3  mm  Est. FW:    2332  gm      5 lb 2 oz     92  % ---------------------------------------------------------------------- OB History  Gravidity:    1         Term:   0        Prem:   0        SAB:   0  TOP:          0       Ectopic:  0        Living: 0 ---------------------------------------------------------------------- Gestational Age  LMP:           34w 3d        Date:  02/03/23                   EDD:   11/10/23  U/S Today:     33w 6d                                        EDD:   11/14/23  Best:          32w 1d     Det. By:  U/S C R L  (04/10/23)    EDD:   11/26/23 ---------------------------------------------------------------------- Anatomy  Cranium:               Previously seen        Aortic Arch:            Previously seen  Cavum:                 Previously seen        Ductal Arch:            Previously seen  Ventricles:            Appears normal         Diaphragm:              Appears normal  Choroid Plexus:        Previously seen        Stomach:                Appears normal,  left                                                                         sided  Cerebellum:            Previously seen        Abdomen:                Previously seen  Posterior Fossa:       Previously seen        Abdominal Wall:         Previously seen  Face:                  Orbits and profile     Cord Vessels:           Previously seen                         previously seen  Lips:                  Previously seen        Kidneys:                Appear normal  Thoracic:              Previously seen        Bladder:                Appears normal  Heart:                 Appears normal         Spine:                  Previously seen                         (4CH, axis, and                         situs)  RVOT:                  Previously seen        Upper Extremities:      Previously seen  LVOT:                  Previously seen        Lower Extremities:      Previously seen ---------------------------------------------------------------------- Cervix Uterus Adnexa  Cervix  Not visualized (advanced GA >24wks)  Uterus  No abnormality visualized.  Right Ovary  Size(cm)     3.43   x   2.85   x  1.46      Vol(ml): 7.47  Within normal limits.  Left Ovary  Not visualized.  Cul De Sac  No free fluid seen.  Adnexa  No adnexal mass visualized ---------------------------------------------------------------------- Impression  G1 P0 at 32w 1d gestation.  Patient is here for fetal growth  assessment.  -Hypothyroidism.  Patient had recent consultation with her  endocrinologist who has increased her levothyroxine  dose to  150 micrograms daily.  - Advanced maternal age.  On cell free fetal DNA screening,  the risks of  fetal aneuploidy's are not increased.  - Marginal cord insertion.  Ultrasound  On today's ultrasound, the estimated fetal weight is at the  92nd percentile and the abdominal circumference  measurement at the 99th percentile.  Amniotic fluid is normal  and good fetal activity seen.  Marginal cord insertion is seen  again.  Estimation of fetal  weight  I counseled the couple that ultrasound has limitations in  accurately estimating fetal weights and there is a margin of  error up to 15%.  Induction of labor before [redacted] weeks  gestation is not indicated for suspected fetal macrosomia.  In the absence of gestational diabetes, the likelihood of  shoulder dystocia is low.  I recommended a follow-up fetal growth assessment and  estimation of fetal weight in 6 weeks.  I encouraged the patient to take levothyroxine  doses  regularly.  Hypothyroidism can be associated with increased  likelihood of preeclampsia.  Patient is not taking low-dose  aspirin. ---------------------------------------------------------------------- Recommendations  -An appointment was made for her to return in 6 weeks for  fetal growth assessment and estimation of fetal weight. ----------------------------------------------------------------------                 Fredia Fresh, MD Electronically Signed Final Report   10/02/2023 10:55 am ----------------------------------------------------------------------    Assessment and Plan:  Pregnancy: G1P0000 at [redacted]w[redacted]d 1. Excessive fetal growth affecting management of pregnancy in third trimester, single or unspecified fetus 2. Marginal insertion of umbilical cord affecting management of mother (Primary) 10/02/23 EFW 92%, AC>99%.  Repeat scan is on 11/13/23. If EFW still >90%, IOL at 39 weeks recommended. This was discussed with patient.  3. AMA (advanced maternal age) primigravida 35+, third trimester LR NIPS  4. Postoperative hypothyroidism Followed by Endocrinology, Synthroid  recently increased to 175 mcg daily  5. [redacted] weeks gestation of pregnancy 6. Supervision of high risk pregnancy, antepartum Labor symptoms and general obstetric precautions including but not limited to vaginal bleeding, contractions, leaking of fluid and fetal movement were reviewed in detail with the patient. Please refer to After Visit Summary for other counseling  recommendations.   Return in about 1 week (around 11/15/2023) for OFFICE OB VISIT (MD only).  Future Appointments  Date Time Provider Department Center  11/13/2023  8:15 AM [facility] PROVIDER 1 [facility]  11/13/2023  8:30 AM [facility] US4 [facility]  11/15/2023  8:55 AM Izell Harari, MD [facility]  11/22/2023  8:55 AM Fredirick Glenys RAMAN, MD [facility]  11/29/2023  8:55 AM Izell Harari, MD [facility]    Gloris Hugger, MD

## 2023-11-13 ENCOUNTER — Ambulatory Visit (HOSPITAL_BASED_OUTPATIENT_CLINIC_OR_DEPARTMENT_OTHER): Admitting: Obstetrics and Gynecology

## 2023-11-13 ENCOUNTER — Ambulatory Visit: Attending: Obstetrics and Gynecology

## 2023-11-13 DIAGNOSIS — O09523 Supervision of elderly multigravida, third trimester: Secondary | ICD-10-CM

## 2023-11-13 DIAGNOSIS — O3663X0 Maternal care for excessive fetal growth, third trimester, not applicable or unspecified: Secondary | ICD-10-CM

## 2023-11-13 DIAGNOSIS — O99213 Obesity complicating pregnancy, third trimester: Secondary | ICD-10-CM

## 2023-11-13 DIAGNOSIS — E669 Obesity, unspecified: Secondary | ICD-10-CM | POA: Diagnosis not present

## 2023-11-13 DIAGNOSIS — O43199 Other malformation of placenta, unspecified trimester: Secondary | ICD-10-CM | POA: Diagnosis not present

## 2023-11-13 DIAGNOSIS — O358XX0 Maternal care for other (suspected) fetal abnormality and damage, not applicable or unspecified: Secondary | ICD-10-CM | POA: Diagnosis not present

## 2023-11-13 DIAGNOSIS — O99280 Endocrine, nutritional and metabolic diseases complicating pregnancy, unspecified trimester: Secondary | ICD-10-CM

## 2023-11-13 DIAGNOSIS — E039 Hypothyroidism, unspecified: Secondary | ICD-10-CM

## 2023-11-13 DIAGNOSIS — Z3A38 38 weeks gestation of pregnancy: Secondary | ICD-10-CM | POA: Insufficient documentation

## 2023-11-13 DIAGNOSIS — O99283 Endocrine, nutritional and metabolic diseases complicating pregnancy, third trimester: Secondary | ICD-10-CM | POA: Diagnosis not present

## 2023-11-13 DIAGNOSIS — O43193 Other malformation of placenta, third trimester: Secondary | ICD-10-CM

## 2023-11-13 NOTE — Progress Notes (Signed)
 Maternal-Fetal Medicine Consultation  Name: Linda Edwards  MRN: [id number]  GA: G1P0000 [redacted]w[redacted]d   Patient is here for fetal growth assessment. - Hypothyroidism.  She is being followed by her endocrinologist and has recently increased levothyroxine  dosage to 175 micrograms daily.  Recent TSH levels are within normal range.  She is euthyroid now. - Marginal cord insertion. - Advanced maternal age (38 years).  Ultrasound Estimated fetal weight is at the 98 percentile and the abdominal circumference measurement at the 99th percentile.  Cephalic presentation.  Normal amniotic fluid.  We discussed ultrasound accuracy and estimated fetal weight and timing of delivery.  Ultrasound has limitations in accurately estimating fetal weights and there is a 15% margin of error.  I counseled the patient that we do not recommend induction of labor before [redacted] weeks gestation.  Hypothyroidism is controlled with thyroxine supplements.  Her blood pressure today at our office is 133/89 mmHg.  Patient had questions about delivering at [redacted] weeks gestation because of advanced maternal age.  I counseled the patient that our guidelines suggest delivery at [redacted] weeks gestation in women over 22 years of age.  However, if cervix is favorable, induction at [redacted] weeks gestation is reasonable. I recommended epidural analgesia.  Patient reports she has scoliosis and will be discussing with the anesthesiologist.  Recommendations - No follow-up appointments were made.     Consultation including face-to-face (more than 50%) counseling 20 minutes.

## 2023-11-15 ENCOUNTER — Ambulatory Visit: Admitting: Obstetrics and Gynecology

## 2023-11-15 VITALS — BP 132/83 | HR 97 | Wt 207.0 lb

## 2023-11-15 DIAGNOSIS — O43199 Other malformation of placenta, unspecified trimester: Secondary | ICD-10-CM

## 2023-11-15 DIAGNOSIS — O3663X0 Maternal care for excessive fetal growth, third trimester, not applicable or unspecified: Secondary | ICD-10-CM

## 2023-11-15 DIAGNOSIS — O09513 Supervision of elderly primigravida, third trimester: Secondary | ICD-10-CM

## 2023-11-15 DIAGNOSIS — E890 Postprocedural hypothyroidism: Secondary | ICD-10-CM

## 2023-11-15 DIAGNOSIS — Z9889 Other specified postprocedural states: Secondary | ICD-10-CM

## 2023-11-15 DIAGNOSIS — Z9089 Acquired absence of other organs: Secondary | ICD-10-CM

## 2023-11-15 DIAGNOSIS — O0990 Supervision of high risk pregnancy, unspecified, unspecified trimester: Secondary | ICD-10-CM

## 2023-11-15 DIAGNOSIS — M419 Scoliosis, unspecified: Secondary | ICD-10-CM | POA: Diagnosis not present

## 2023-11-15 DIAGNOSIS — Z3A38 38 weeks gestation of pregnancy: Secondary | ICD-10-CM

## 2023-11-15 NOTE — Progress Notes (Signed)
   PRENATAL VISIT NOTE  Subjective:  Linda Edwards is a 38 y.o. G1P0000 at [redacted]w[redacted]d being seen today for ongoing prenatal care.  She is currently monitored for the following issues for this low-risk pregnancy and has H/O total thyroidectomy; Papillary carcinoma of thyroid  (HCC); Hypothyroid; Insulin  resistance; Postoperative hypothyroidism; Supervision of high risk pregnancy, antepartum; Obesity affecting pregnancy; Marginal insertion of umbilical cord affecting management of mother; Excessive fetal growth affecting management of mother in third trimester, antepartum; and AMA (advanced maternal age) primigravida 35+, third trimester on their problem list.  Patient reports no complaints.  Contractions: Not present. Vag. Bleeding: None.  Movement: Present. Denies leaking of fluid.   The following portions of the patient's history were reviewed and updated as appropriate: allergies, current medications, past family history, past medical history, past social history, past surgical history and problem list.   Objective:    Vitals:   11/15/23 0856  BP: 132/83  Pulse: 97  Weight: 207 lb (93.9 kg)    Fetal Status:  Fetal Heart Rate (bpm): 152   Movement: Present    General: Alert, oriented and cooperative. Patient is in no acute distress.  Skin: Skin is warm and dry. No rash noted.   Cardiovascular: Normal heart rate noted  Respiratory: Normal respiratory effort, no problems with respiration noted  Abdomen: Soft, gravid, appropriate for gestational age.  Pain/Pressure: Absent     Pelvic: Cervical exam deferred        Extremities: Normal range of motion.  Edema: None  Mental Status: Normal mood and affect. Normal behavior. Normal judgment and thought content.   Assessment and Plan:  Pregnancy: G1P0000 at [redacted]w[redacted]d 1. Scoliosis, unspecified scoliosis type, unspecified spinal region (Primary)  2. AMA (advanced maternal age) primigravida 71+, third trimester Seen by mfm okay to go post dates  3.  Excessive fetal growth affecting management of pregnancy in third trimester, single or unspecified fetus See above. Pt declines elective IOL for LGA after discussion. 10/14: efw 98%, 4105g, ac >99%, afi 18   4. H/O total thyroidectomy Followed by Dr. Tommas. Currently at 175. I asked her touch base with her doctor to see about her regimen postpartum  5. Postablative hypothyroidism  6. Marginal insertion of umbilical cord affecting management of mother  7. Supervision of high risk pregnancy, antepartum  Term labor symptoms and general obstetric precautions including but not limited to vaginal bleeding, contractions, leaking of fluid and fetal movement were reviewed in detail with the patient. Please refer to After Visit Summary for other counseling recommendations.   Return in 1 week (on 11/22/2023) for in person, in office nst/bpp.  Future Appointments  Date Time Provider Department Center  11/22/2023  8:55 AM Linda Glenys RAMAN, MD [facility]  11/29/2023  8:55 AM Linda Harari, MD [facility]  11/30/2023  9:15 AM [facility]    Edwards Izell, MD

## 2023-11-22 ENCOUNTER — Encounter: Payer: Self-pay | Admitting: Family Medicine

## 2023-11-22 ENCOUNTER — Ambulatory Visit: Admitting: Family Medicine

## 2023-11-22 VITALS — BP 130/88 | HR 99 | Wt 207.0 lb

## 2023-11-22 DIAGNOSIS — O0990 Supervision of high risk pregnancy, unspecified, unspecified trimester: Secondary | ICD-10-CM | POA: Diagnosis not present

## 2023-11-22 DIAGNOSIS — Z3A39 39 weeks gestation of pregnancy: Secondary | ICD-10-CM

## 2023-11-22 DIAGNOSIS — O09513 Supervision of elderly primigravida, third trimester: Secondary | ICD-10-CM

## 2023-11-22 DIAGNOSIS — O3663X0 Maternal care for excessive fetal growth, third trimester, not applicable or unspecified: Secondary | ICD-10-CM

## 2023-11-22 DIAGNOSIS — E890 Postprocedural hypothyroidism: Secondary | ICD-10-CM

## 2023-11-22 DIAGNOSIS — O43199 Other malformation of placenta, unspecified trimester: Secondary | ICD-10-CM | POA: Diagnosis not present

## 2023-11-22 NOTE — Progress Notes (Signed)
   PRENATAL VISIT NOTE  Subjective:  Linda Edwards is a 38 y.o. G1P0000 at [redacted]w[redacted]d being seen today for ongoing prenatal care.  She is currently monitored for the following issues for this high-risk pregnancy and has H/O total thyroidectomy; Papillary carcinoma of thyroid  (HCC); Hypothyroid; Insulin  resistance; Postoperative hypothyroidism; Supervision of high risk pregnancy, antepartum; Obesity affecting pregnancy; Marginal insertion of umbilical cord affecting management of mother; Excessive fetal growth affecting management of mother in third trimester, antepartum; and AMA (advanced maternal age) primigravida 35+, third trimester on their problem list.  Patient reports no complaints.  Contractions: Not present. Vag. Bleeding: None.  Movement: Present. Denies leaking of fluid.   The following portions of the patient's history were reviewed and updated as appropriate: allergies, current medications, past family history, past medical history, past social history, past surgical history and problem list.   Objective:    Vitals:   11/22/23 0854  BP: 130/88  Pulse: 99  Weight: 207 lb (93.9 kg)    Fetal Status:  Fetal Heart Rate (bpm): 148 Fundal Height: 39 cm Movement: Present Presentation: Vertex  General: Alert, oriented and cooperative. Patient is in no acute distress.  Skin: Skin is warm and dry. No rash noted.   Cardiovascular: Normal heart rate noted  Respiratory: Normal respiratory effort, no problems with respiration noted  Abdomen: Soft, gravid, appropriate for gestational age.  Pain/Pressure: Absent     Pelvic: Cervical exam deferred        Extremities: Normal range of motion.     Mental Status: Normal mood and affect. Normal behavior. Normal judgment and thought content.   Assessment and Plan:  Pregnancy: G1P0000 at [redacted]w[redacted]d 1. Postoperative hypothyroidism (Primary) On synthroid , managed by Endocrinology  2. Supervision of high risk pregnancy, antepartum Continue prenatal  care.  3. Marginal insertion of umbilical cord affecting management of mother U/s for growth, done  4. AMA (advanced maternal age) primigravida 35+, third trimester LR NIPT  5. Excessive fetal growth affecting management of pregnancy in third trimester, single or unspecified fetus EFW @ 98% 405 gm 10 days ago For IOL now that she is 39 weeks Discussed IOL and risks of this, vs. Waiting for the baby to grow larger  6. [redacted] weeks gestation of pregnancy   Term labor symptoms and general obstetric precautions including but not limited to vaginal bleeding, contractions, leaking of fluid and fetal movement were reviewed in detail with the patient. Please refer to After Visit Summary for other counseling recommendations.   Return in 4 weeks (on 12/20/2023).  Future Appointments  Date Time Provider Department Center  11/25/2023  6:45 AM [facility] SCHED ROOM [facility] None    Linda GORMAN Birk, MD

## 2023-11-23 ENCOUNTER — Telehealth (HOSPITAL_COMMUNITY): Payer: Self-pay | Admitting: *Deleted

## 2023-11-23 ENCOUNTER — Encounter (HOSPITAL_COMMUNITY): Payer: Self-pay | Admitting: *Deleted

## 2023-11-23 NOTE — Telephone Encounter (Signed)
 Preadmission screen

## 2023-11-25 ENCOUNTER — Other Ambulatory Visit: Payer: Self-pay

## 2023-11-25 ENCOUNTER — Encounter (HOSPITAL_COMMUNITY): Payer: Self-pay | Admitting: Family Medicine

## 2023-11-25 ENCOUNTER — Inpatient Hospital Stay (HOSPITAL_COMMUNITY)
Admission: RE | Admit: 2023-11-25 | Discharge: 2023-11-30 | DRG: 786 | Disposition: A | Discharge status: Home / Self Care | Attending: Family Medicine | Admitting: Family Medicine

## 2023-11-25 ENCOUNTER — Inpatient Hospital Stay (HOSPITAL_COMMUNITY)

## 2023-11-25 DIAGNOSIS — O9081 Anemia of the puerperium: Secondary | ICD-10-CM | POA: Diagnosis not present

## 2023-11-25 DIAGNOSIS — Z87891 Personal history of nicotine dependence: Secondary | ICD-10-CM | POA: Diagnosis not present

## 2023-11-25 DIAGNOSIS — O4423 Partial placenta previa NOS or without hemorrhage, third trimester: Secondary | ICD-10-CM | POA: Diagnosis not present

## 2023-11-25 DIAGNOSIS — Z833 Family history of diabetes mellitus: Secondary | ICD-10-CM | POA: Diagnosis not present

## 2023-11-25 DIAGNOSIS — O480 Post-term pregnancy: Secondary | ICD-10-CM | POA: Diagnosis not present

## 2023-11-25 DIAGNOSIS — Z85850 Personal history of malignant neoplasm of thyroid: Secondary | ICD-10-CM

## 2023-11-25 DIAGNOSIS — O99214 Obesity complicating childbirth: Secondary | ICD-10-CM | POA: Diagnosis not present

## 2023-11-25 DIAGNOSIS — Z3A39 39 weeks gestation of pregnancy: Secondary | ICD-10-CM

## 2023-11-25 DIAGNOSIS — O3663X0 Maternal care for excessive fetal growth, third trimester, not applicable or unspecified: Secondary | ICD-10-CM | POA: Diagnosis not present

## 2023-11-25 DIAGNOSIS — O43893 Other placental disorders, third trimester: Secondary | ICD-10-CM | POA: Diagnosis not present

## 2023-11-25 DIAGNOSIS — O411230 Chorioamnionitis, third trimester, not applicable or unspecified: Secondary | ICD-10-CM | POA: Diagnosis not present

## 2023-11-25 DIAGNOSIS — Z79890 Hormone replacement therapy (postmenopausal): Secondary | ICD-10-CM | POA: Diagnosis not present

## 2023-11-25 DIAGNOSIS — D62 Acute posthemorrhagic anemia: Secondary | ICD-10-CM | POA: Diagnosis not present

## 2023-11-25 DIAGNOSIS — Z98891 History of uterine scar from previous surgery: Secondary | ICD-10-CM

## 2023-11-25 DIAGNOSIS — O134 Gestational [pregnancy-induced] hypertension without significant proteinuria, complicating childbirth: Secondary | ICD-10-CM | POA: Diagnosis not present

## 2023-11-25 DIAGNOSIS — Z880 Allergy status to penicillin: Secondary | ICD-10-CM

## 2023-11-25 DIAGNOSIS — E890 Postprocedural hypothyroidism: Secondary | ICD-10-CM | POA: Diagnosis not present

## 2023-11-25 DIAGNOSIS — O99284 Endocrine, nutritional and metabolic diseases complicating childbirth: Secondary | ICD-10-CM | POA: Diagnosis not present

## 2023-11-25 DIAGNOSIS — O43193 Other malformation of placenta, third trimester: Secondary | ICD-10-CM | POA: Diagnosis present

## 2023-11-25 DIAGNOSIS — Z3A40 40 weeks gestation of pregnancy: Secondary | ICD-10-CM | POA: Diagnosis not present

## 2023-11-25 DIAGNOSIS — O2293 Venous complication in pregnancy, unspecified, third trimester: Secondary | ICD-10-CM | POA: Diagnosis not present

## 2023-11-25 LAB — TYPE AND SCREEN
ABO/RH(D): A POS
Antibody Screen: NEGATIVE

## 2023-11-25 LAB — CBC
HCT: 36.6 % (ref 36.0–46.0)
Hemoglobin: 11.7 g/dL — ABNORMAL LOW (ref 12.0–15.0)
MCH: 25.2 pg — ABNORMAL LOW (ref 26.0–34.0)
MCHC: 32 g/dL (ref 30.0–36.0)
MCV: 78.9 fL — ABNORMAL LOW (ref 80.0–100.0)
Platelets: 339 K/uL (ref 150–400)
RBC: 4.64 MIL/uL (ref 3.87–5.11)
RDW: 16.2 % — ABNORMAL HIGH (ref 11.5–15.5)
WBC: 11.7 K/uL — ABNORMAL HIGH (ref 4.0–10.5)
nRBC: 0 % (ref 0.0–0.2)

## 2023-11-25 LAB — RPR: RPR Ser Ql: NONREACTIVE

## 2023-11-25 MED ORDER — LACTATED RINGERS IV SOLN
500.0000 mL | INTRAVENOUS | Status: AC | PRN
  Administered 2023-11-26: 500 mL via INTRAVENOUS

## 2023-11-25 MED ORDER — MISOPROSTOL 25 MCG QUARTER TABLET
25.0000 ug | ORAL_TABLET | Freq: Once | ORAL | Status: AC
Start: 2023-11-25 — End: 2023-11-25
  Administered 2023-11-25: 25 ug via VAGINAL
  Filled 2023-11-25: qty 1

## 2023-11-25 MED ORDER — LACTATED RINGERS IV SOLN: INTRAVENOUS | Status: AC

## 2023-11-25 MED ORDER — OXYTOCIN BOLUS FROM INFUSION: 333.0000 mL | Freq: Once | INTRAVENOUS | Status: DC

## 2023-11-25 MED ORDER — LIDOCAINE HCL (PF) 1 % IJ SOLN: 30.0000 mL | INTRAMUSCULAR | Status: DC | PRN

## 2023-11-25 MED ORDER — FENTANYL CITRATE (PF) 100 MCG/2ML IJ SOLN
50.0000 ug | INTRAMUSCULAR | Status: DC | PRN
  Administered 2023-11-26 (×2): 100 ug via INTRAVENOUS
  Filled 2023-11-25 (×2): qty 2

## 2023-11-25 MED ORDER — ACETAMINOPHEN 325 MG PO TABS: 650.0000 mg | ORAL_TABLET | ORAL | Status: DC | PRN

## 2023-11-25 MED ORDER — OXYTOCIN-SODIUM CHLORIDE 30-0.9 UT/500ML-% IV SOLN
2.5000 [IU]/h | INTRAVENOUS | Status: DC
  Filled 2023-11-25: qty 500

## 2023-11-25 MED ORDER — TERBUTALINE SULFATE 1 MG/ML IJ SOLN: 0.2500 mg | Freq: Once | INTRAMUSCULAR | Status: DC | PRN

## 2023-11-25 MED ORDER — SOD CITRATE-CITRIC ACID 500-334 MG/5ML PO SOLN
30.0000 mL | ORAL | Status: DC | PRN
  Administered 2023-11-27: 30 mL via ORAL
  Filled 2023-11-25: qty 30

## 2023-11-25 MED ORDER — ONDANSETRON HCL 4 MG/2ML IJ SOLN
4.0000 mg | Freq: Four times a day (QID) | INTRAMUSCULAR | Status: DC | PRN
  Administered 2023-11-27: 4 mg via INTRAVENOUS

## 2023-11-25 MED ORDER — MISOPROSTOL 50MCG HALF TABLET
50.0000 ug | ORAL_TABLET | ORAL | Status: DC
  Administered 2023-11-25: 50 ug via ORAL
  Filled 2023-11-25: qty 1

## 2023-11-25 MED ORDER — MISOPROSTOL 50MCG HALF TABLET
50.0000 ug | ORAL_TABLET | Freq: Once | ORAL | Status: AC
  Administered 2023-11-25: 50 ug via ORAL
  Filled 2023-11-25: qty 1

## 2023-11-25 MED ORDER — MISOPROSTOL 25 MCG QUARTER TABLET: 25.0000 ug | ORAL_TABLET | Freq: Once | ORAL | Status: DC

## 2023-11-25 MED ORDER — FLEET ENEMA RE ENEM: 1.0000 | ENEMA | RECTAL | Status: DC | PRN

## 2023-11-25 NOTE — H&P (Signed)
 HPI: Linda Edwards is a 38 y.o. year old G82P0000 female at [redacted]w[redacted]d weeks gestation by 7-week ultrasound who presents to Labor and Delivery for induction of labor for suspected LGA,.  11/13/23: EFW: 4105 gm 9 lb 1 oz 98 %.  AC greater than 99th percentile.  marginal cord insertion NURSING  PROVIDER  Office Location [facility] Dating by U/S at 7 wks  [facility] Model Traditional Anatomy U/S WNL, MCI  Initiated care at  [facility]  English              LAB RESULTS   Support Person FOB-Rogger  Genetics NIPS: LR female AFP:     NT/IT (FT only)     Carrier Screen Horizon: Neg CF, SMA 2 copies, Hgb AA  Rhogam  A/Positive/-- (03/11 0917) A1C/GTT Early HgbA1C: 5.2 Third trimester 2 hr GTT: 82/160/134  Flu Vaccine Declined     TDaP Vaccine  10/08/2023 Blood Type A/Positive/-- (03/11 0917)  RSV Vaccine NA Antibody Negative (03/11 0917)  COVID Vaccine Received Rubella 20.20 (03/11 0917) IMMUNE  Feeding Plan Breast  RPR Non Reactive (08/07 0851)  Contraception Undecided HBsAg Negative (03/11 0917)  Circumcision NA HIV Non Reactive (08/07 0851)  Pediatrician  List given HCVAb Non Reactive (03/11 0917)  Prenatal Classes     BTL Consent  Pap WNL neg HPV   BTL Pre-payment  GC/CT Initial:   36wks:    VBAC Consent  GBS Negative/-- (10/02 1630)  BRx Optimized? [ ]  yes   [ ]  no    DME Rx [ X] BP cuff [ ]  Weight Scale Waterbirth  [ ]  Class [ ]  Consent [ ]  CNM visit  PHQ9 & GAD7 [ x ] new OB [x]  28 weeks  [  ] 36 weeks Induction  [ ]  Orders Entered [ ] Foley Y/N    OB History     Gravida  1   Para  0   Term  0   Preterm  0   AB  0   Living  0      SAB  0   IAB  0   Ectopic  0   Multiple  0   Live Births  0          Past Medical History:  Diagnosis Date   Biological false positive RPR test 05/02/2023   Negative T. 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 ETTERthelbert 07/28/2016)   Vitamin D  deficiency 09/14/2021   Past Surgical History:  Procedure Laterality Date   THYROID  LOBECTOMY Left 07/28/2016   THYROIDECTOMY Right 07/25/2016   Procedure: RIGHT HEMI THYROIDECTOMY;  Surgeon: Karis Clunes, MD;  Location:  SURGERY CENTER;  Service: ENT;  Laterality: Right;   THYROIDECTOMY N/A 07/28/2016   Procedure: COMPLETION THYROIDECTOMY;  Surgeon: Karis Clunes, MD;  Location: [facility];  Service: ENT;  Laterality: N/A;   Family History: family history includes Cancer in her mother; Diabetes in her maternal grandfather; Lung cancer in her mother; Lupus in her sister. Social History:  reports that she quit smoking about 19 years ago. Her smoking use included cigarettes. She started smoking about 21 years ago. She has been exposed to tobacco smoke. She has never used smokeless  tobacco. She reports that she does not currently use alcohol. She reports that she does not use drugs.     Maternal Diabetes: No Genetic Screening: Normal Maternal Ultrasounds/Referrals marginal cord insertion Fetal Ultrasounds or other Referrals:  None Maternal Substance Abuse:  No Significant Maternal Medications:  None Significant Maternal Lab Results:  Group B Strep negative Number of Prenatal Visits:greater than 3 verified prenatal visits Maternal Vaccinations:TDap Other Comments:  None  Review of Systems  Constitutional:  Negative for chills and fever.  Eyes:  Negative for visual disturbance.  Gastrointestinal:  Negative for abdominal pain, nausea and vomiting.  Genitourinary:  Negative for vaginal bleeding and vaginal discharge.  Neurological:  Negative for headaches.   Maternal Medical History:  Reason for admission: Nausea.      Height 5' 3 (1.6 m), weight 93.4 kg, last menstrual period 02/03/2023. Maternal Exam:  Uterine Assessment: Contraction strength is mild.  Contraction frequency is  irregular.  Abdomen: Patient reports no abdominal tenderness. Estimated fetal weight is 9-8.   Fetal presentation: vertex Vertex by ultrasound Introitus: Normal vulva. Normal vagina.  Vagina is negative for discharge.  Pelvis: adequate for delivery.   Cervix: Cervix evaluated by digital exam.     Fetal Exam Fetal Monitor Review: Baseline rate: 140.  Pattern: accelerations present and no decelerations.   Fetal State Assessment: Category I - tracings are normal.   Physical Exam Constitutional:      Appearance: She is well-developed.  HENT:     Head: Normocephalic.  Eyes:     Conjunctiva/sclera: Conjunctivae normal.  Cardiovascular:     Rate and Rhythm: Normal rate and regular rhythm.     Heart sounds: Normal heart sounds.  Pulmonary:     Effort: Pulmonary effort is normal. No respiratory distress.     Breath sounds: Normal breath sounds.  Abdominal:     Palpations: Abdomen is soft.     Tenderness: There is no abdominal tenderness.  Genitourinary:    General: Normal vulva.     Vagina: No vaginal discharge or bleeding.  Musculoskeletal:        General: Normal range of motion.     Cervical back: Normal range of motion and neck supple.     Right lower leg: No edema.     Left lower leg: No edema.  Skin:    General: Skin is warm and dry.  Neurological:     Mental Status: She is alert and oriented to person, place, and time.  Psychiatric:        Mood and Affect: Mood normal.     Prenatal labs: ABO, Rh: A/Positive/-- (03/11 9082) Antibody: Negative (03/11 0917) Rubella: 20.20 (03/11 0917) RPR: Non Reactive (08/07 0851)  HBsAg: Negative (03/11 0917)  HIV: Non Reactive (08/07 0851)  GBS: Negative/-- (10/02 1630)   Assessment: 1. Labor: Induction of labor for suspected LGA 2. Fetal Wellbeing: Category 1 3. Pain Control: Comfort measures.  Hoping to avoid epidural. 4. GBS: Negative 5.  39.6 week IUP 6.  Marginal cord insertion  Plan:  1. Admit to BS per consult with  MD 2. Routine L&D orders 3. Analgesia/anesthesia PRN  4.  Cytotec then AROM, Pitocin as needed 5.  Shoulder dystocia precautions   [name]  [name] 11/25/2023, 8:40 AM

## 2023-11-25 NOTE — H&P (Addendum)
 OBSTETRIC ADMISSION HISTORY AND PHYSICAL  Linda Edwards is a 38 y.o. female G1P0000 with IUP at [redacted]w[redacted]d by 7 wk US  presenting for IUD.   She reports +FMs, No LOF, no VB, no blurry vision, headaches or peripheral edema, and RUQ pain.  She plans on breast feeding. She is unsure, but plans to wait for 6wk PP checkup for starting birth control. 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  Past Medical History: Past Medical History:  Diagnosis Date   Biological false positive RPR test 05/02/2023   Negative T. 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 ETTERthelbert 07/28/2016)   Vitamin D  deficiency 09/14/2021    Past Surgical History: Past Surgical History:  Procedure Laterality Date   THYROID  LOBECTOMY Left 07/28/2016   THYROIDECTOMY Right 07/25/2016   Procedure: RIGHT HEMI THYROIDECTOMY;  Surgeon: Karis Clunes, MD;  Location: [facility];  Service: ENT;  Laterality: Right;   THYROIDECTOMY N/A 07/28/2016   Procedure: COMPLETION THYROIDECTOMY;  Surgeon: Karis Clunes, MD;  Location: [facility] OR;  Service: ENT;  Laterality: N/A;    Obstetrical History: OB History     Gravida  1   Para  0   Term  0   Preterm  0   AB  0   Living  0      SAB  0   IAB  0   Ectopic  0   Multiple  0   Live Births  0           Social History Social History   Socioeconomic History   Marital status: Married    Spouse name: Not on file   Number of children: Not on  file   Years of education: Not on file   Highest education level: Associate degree: academic program  Occupational History    Comment: works from home  Tobacco Use   Smoking status: Former    Current packs/day: 0.00    Types: Cigarettes    Start date: 01/29/2002    Quit date: 01/30/2004    Years since quitting: 19.8    Passive exposure: Past   Smokeless tobacco: Never  Vaping Use   Vaping status: Never Used  Substance and Sexual Activity   Alcohol use: Not Currently    Comment: glass of wine/month, maybe   Drug use: No   Sexual activity: Yes    Birth control/protection: None    Comment: Pregnant  Other Topics Concern   Not on file  Social History Narrative   Not on file   Social Drivers of Health   Financial Resource Strain: Low Risk  (07/26/2023)   Overall Financial Resource Strain (CARDIA)    Difficulty of Paying Living Expenses: Not hard at all  Food Insecurity: No Food Insecurity (11/25/2023)   Hunger Vital Sign    Worried About Running Out of Food in the Last Year: Never true    Ran Out of Food in the Last Year: Never true  Transportation Needs: No  Transportation Needs (11/25/2023)   PRAPARE - Administrator, Civil Service (Medical): No    Lack of Transportation (Non-Medical): No  Physical Activity: Sufficiently Active (07/26/2023)   Exercise Vital Sign    Days of Exercise per Week: 5 days    Minutes of Exercise per Session: 30 min  Stress: No Stress Concern Present (07/26/2023)   [employer] of Occupational Health - Occupational Stress Questionnaire    Feeling of Stress: Only a little  Social Connections: Moderately Isolated (07/26/2023)   Social Connection and Isolation Panel    Frequency of Communication with Friends and Family: Three times a week    Frequency of Social Gatherings with Friends and Family: Once a week    Attends Religious Services: Never    Database Administrator or Organizations: No    Attends Engineer, Structural:  Not on file    Marital Status: Married    Family History: Family History  Problem Relation Age of Onset   Lung cancer Mother    Cancer Mother    Lupus Sister    Diabetes Maternal Grandfather     Allergies: Allergies  Allergen Reactions   Other Diarrhea    Penicillin     Medications Prior to Admission  Medication Sig Dispense Refill Last Dose/Taking   Cholecalciferol (VITAMIN D ) 50 MCG (2000 UT) CAPS 1 capsule Orally Once a day      levothyroxine  (SYNTHROID ) 175 MCG tablet Take 175 mcg by mouth daily.      Prenatal Vit-Fe Fumarate-FA (PRENATAL VITAMIN) 27-0.8 MG TABS Take 1 tablet by mouth daily. 90 tablet 3      Review of Systems   All systems reviewed and negative except as stated in HPI  Blood pressure 138/77, pulse 77, temperature 97.8 F (36.6 C), temperature source Oral, resp. rate 18, height 5' 3 (1.6 m), weight 93.4 kg, last menstrual period 02/03/2023. General appearance: alert, cooperative, and appears stated age Lungs: clear to auscultation bilaterally Heart: regular rate and rhythm Abdomen: soft, non-tender; bowel sounds normal Pelvic: Normal exam, anterior cervix Presentation: cephalic Fetal monitoringBaseline: 140 bpm, Variability: Good {> 6 bpm), Accelerations: Reactive, and Decelerations: Absent Uterine activity: Patient feeling contractions, not picked up well on the monitor Dilation: 1 Effacement (%): 70 Station: -3 Exam by:: Ysabela Keisler MD   Prenatal labs: ABO, Rh: --/--/A POS (10/26 0850) Antibody: NEG (10/26 0850) Rubella: 20.20 (03/11 0917) RPR: NON REACTIVE (10/26 0850)  HBsAg: Negative (03/11 0917)  HIV: Non Reactive (08/07 0851)  GBS: Negative/-- (10/02 1630)    Lab Results  Component Value Date   GBS Negative 11/01/2023   GTT normal Genetic screening  NIPS normal, female Anatomy US  Completed, LGA baby noted  Immunization History  Administered Date(s) Administered   Tdap 10/08/2023    Prenatal Transfer Tool  Maternal  Diabetes: No Genetic Screening: Normal Maternal Ultrasounds/Referrals: Other: LGA 98% Fetal Ultrasounds or other Referrals:  Referred to Materal Fetal Medicine  Maternal Substance Abuse:  No Significant Maternal Medications:  Meds include: Other: ON synthroid  Significant Maternal Lab Results: Other:  Number of Prenatal Visits:greater than 3 verified prenatal visits Maternal Vaccinations:TDap Other Comments:  None   Results for orders placed or performed during the hospital encounter of 11/25/23 (from the past 24 hours)  CBC   Collection Time: 11/25/23  8:50 AM  Result Value Ref Range   WBC 11.7 (H) 4.0 - 10.5 K/uL   RBC 4.64 3.87 - 5.11 MIL/uL   Hemoglobin 11.7 (L) 12.0 - 15.0  g/dL   HCT 63.3 63.9 - 53.9 %   MCV 78.9 (L) 80.0 - 100.0 fL   MCH 25.2 (L) 26.0 - 34.0 pg   MCHC 32.0 30.0 - 36.0 g/dL   RDW 83.7 (H) 88.4 - 84.4 %   Platelets 339 150 - 400 K/uL   nRBC 0.0 0.0 - 0.2 %  RPR   Collection Time: 11/25/23  8:50 AM  Result Value Ref Range   RPR Ser Ql NON REACTIVE NON REACTIVE  Type and screen   Collection Time: 11/25/23  8:50 AM  Result Value Ref Range   ABO/RH(D) A POS    Antibody Screen NEG    Sample Expiration      11/28/2023,2359 Performed at [facility], [address] [place], [place] [address]     Patient Active Problem List   Diagnosis Date Noted   Indication for care in labor or delivery 11/25/2023   Excessive fetal growth affecting management of mother in third trimester, antepartum 10/03/2023   AMA (advanced maternal age) primigravida 35+, third trimester 10/03/2023   Marginal insertion of umbilical cord affecting management of mother 07/03/2023   Obesity affecting pregnancy 06/27/2023   Supervision of high risk pregnancy, antepartum 04/10/2023   Insulin  resistance 09/14/2021   Postoperative hypothyroidism 08/24/2020   Papillary carcinoma of thyroid  (HCC) 04/06/2017   Hypothyroid 04/06/2017   H/O total thyroidectomy 07/28/2016     Assessment/Plan:  Linda Edwards is a 38 y.o. G1P0000 at [redacted]w[redacted]d here for IUD for LGA baby 98%.  #Labor:Porogressing well. Was started with duo cytotec and a buccal cytotec. Cervix dilated to 1 cm, and we were able to place the foley balloon.  #Pain: Currently Managed #FWB: CAT I #GBS status:  negative #Feeding: Breastmilk  #Reproductive Life planning: Combination OCPs #Circ:  not applicable  Linda FORBES Aline, MD  11/25/2023, 9:50 PM   GME ATTESTATION:  Evaluation and management procedures were performed by the [facility] Resident under my supervision. I was immediately available for direct supervision, assistance and direction throughout this encounter.  I also confirm that I have verified the information documented in the resident's note, and that I have also personally reperformed the pertinent components of the physical exam and all of the medical decision making activities.  I have also made any necessary editorial changes.  [name], MD OB Fellow, Faculty Practice [facility], Center for [facility] 11/25/2023 11:13 PM

## 2023-11-26 ENCOUNTER — Inpatient Hospital Stay (HOSPITAL_COMMUNITY): Admitting: Anesthesiology

## 2023-11-26 MED ORDER — DIPHENHYDRAMINE HCL 50 MG/ML IJ SOLN: 12.5000 mg | INTRAMUSCULAR | Status: DC | PRN

## 2023-11-26 MED ORDER — PHENYLEPHRINE 80 MCG/ML (10ML) SYRINGE FOR IV PUSH (FOR BLOOD PRESSURE SUPPORT): 80.0000 ug | PREFILLED_SYRINGE | INTRAVENOUS | Status: DC | PRN

## 2023-11-26 MED ORDER — TERBUTALINE SULFATE 1 MG/ML IJ SOLN: 0.2500 mg | Freq: Once | INTRAMUSCULAR | Status: DC | PRN

## 2023-11-26 MED ORDER — EPHEDRINE 5 MG/ML INJ: 10.0000 mg | INTRAVENOUS | Status: DC | PRN

## 2023-11-26 MED ORDER — FENTANYL-BUPIVACAINE-NACL 0.5-0.125-0.9 MG/250ML-% EP SOLN
12.0000 mL/h | EPIDURAL | Status: DC | PRN
  Administered 2023-11-26: 12 mL/h via EPIDURAL
  Filled 2023-11-26: qty 250

## 2023-11-26 MED ORDER — LACTATED RINGERS IV SOLN: 500.0000 mL | Freq: Once | INTRAVENOUS | Status: DC

## 2023-11-26 MED ORDER — LEVOTHYROXINE SODIUM 75 MCG PO TABS
175.0000 ug | ORAL_TABLET | Freq: Every day | ORAL | Status: DC
  Administered 2023-11-26 – 2023-11-27 (×2): 175 ug via ORAL
  Filled 2023-11-26 (×2): qty 1

## 2023-11-26 MED ORDER — OXYTOCIN-SODIUM CHLORIDE 30-0.9 UT/500ML-% IV SOLN
1.0000 m[IU]/min | INTRAVENOUS | Status: DC
  Administered 2023-11-26: 2 m[IU]/min via INTRAVENOUS

## 2023-11-26 NOTE — Progress Notes (Signed)
 Labor Progress Note Linda Edwards is a 38 y.o. G1P0000 at [redacted]w[redacted]d presented for IOL for LGA  S: Doing well.  O:  BP (!) 135/93   Pulse 78   Temp 97.7 F (36.5 C) (Oral)   Resp 18   Ht 5' 3 (1.6 m)   Wt 93.4 kg   LMP 02/03/2023   BMI 36.49 kg/m  EFM: 135/Moderate Variability/Accelerations (+),Decelerations (-)  CVE: Dilation: 3.5 Effacement (%): 80 Cervical Position: Posterior Station: -3 Presentation: Vertex Exam by:: H.Price, RN   A&P: 38 y.o. G1P0000 [redacted]w[redacted]d  #Labor: Progressing well. Cervix still very posterior. Attempted using bedpan and was unable to hook cervix. Continue titrating up on pitocin as tolerated. Hurting a little more, can consider fentanyl  for some therapeutic rest to see if we can get the head to engage.  #Pain: Per patient request, planning eventual epidural #FWB: Category I #GBS negative #Hypothyroidism: Continue levothyroxine  daily.   Linda Cashatt LITTIE Angles, MD 5:43 PM

## 2023-11-26 NOTE — Progress Notes (Signed)
 Labor Progress Note Linda Edwards is a 38 y.o. G1P0000 at [redacted]w[redacted]d presented for IOL for LGA  S: Doing well.  O:  BP (!) 135/93   Pulse 78   Temp 97.7 F (36.5 C) (Oral)   Resp 18   Ht 5' 3 (1.6 m)   Wt 93.4 kg   LMP 02/03/2023   BMI 36.49 kg/m  EFM: 135/Moderate Variability/Accelerations (+),Decelerations (-)  CVE: Dilation: 3.5 Effacement (%): 80 Cervical Position: Posterior Station: -3 Presentation: Vertex Exam by:: H.Price, RN   A&P: 38 y.o. G1P0000 [redacted]w[redacted]d  #Labor: Progressing well. Attempted cervical exam but cervix extremely posterior and difficult to reach. Continue titrating pitocin as mom and infant tolerates #Pain: Per patient request, planning eventual epidural #FWB: Category I #GBS negative #Hypothyroidism: Continue levothyroxine  daily.   Linda Foxworth LITTIE Angles, MD 5:40 PM

## 2023-11-26 NOTE — Progress Notes (Signed)
 Labor Progress Note Tata Timmins is a 38 y.o. G1P0000 at [redacted]w[redacted]d presenting for IOL for LGA  S: Feeling tired and more uncomfortable with contractions  O:  BP (!) 148/81   Pulse 70   Temp 97.7 F (36.5 C) (Oral)   Resp 18   Ht 5' 3 (1.6 m)   Wt 93.4 kg   LMP 02/03/2023   BMI 36.49 kg/m  Lab Results  Component Value Date   HGB 11.7 (L) 11/25/2023    Time: 10:20 PM  FHT: baseline bpm 130, moderate variability, accelerations present, decelerations none,   Contractions: q 2-3 mins,    CVE: Dilation: 3.5 Effacement (%): 80 Cervical Position: Posterior Station: -3 Presentation: Vertex Exam by:: Dr. Trudy   A&P: 37 y.o. G1P0000 [redacted]w[redacted]d IOL for LGA #Labor: Latent Labor AROM, Cytotec, and IP Foley  #Pain: IV pain meds, epidural when desired #FWB: Category I #GBS negative #iatrogenic hypothyroidism (s/p thyroidectomy due to papillary CA of thyroid )--levothyroxine   Leeroy KATHEE Trudy, MD 10:20 PM

## 2023-11-26 NOTE — Progress Notes (Signed)
 Labor Progress Note Linda Edwards is a 38 y.o. G1P0000 at [redacted]w[redacted]d presenting for IOL for LGA baby  S: Patient's FB came out at 0300, patient requested to rest  O:  BP 121/74 (BP Location: Right Arm)   Pulse 76   Temp 97.8 F (36.6 C) (Oral)   Resp 18   Ht 5' 3 (1.6 m)   Wt 93.4 kg   LMP 02/03/2023   BMI 36.49 kg/m  Lab Results  Component Value Date   HGB 11.7 (L) 11/25/2023    Time: 5:24 AM  FHT: baseline bpm 130, moderate variability, accelerations present, decelerations none,   Contractions: every 2 min   CVE: Dilation: 3.5 Effacement (%): 70 Station: -3 Presentation: Vertex Exam by:: Terryann Verbeek MD   A&P: 38 y.o. G1P0000 [redacted]w[redacted]d IOL for LGA #Labor: Latent Labor Pitocin, Cytotec, and IP Foley  #Pain: IV pain meds, epidural when desired #FWB: Category I #GBS negative #Iatrogenic hypothyroid: synthroid   [name], MD 5:24 AM

## 2023-11-27 ENCOUNTER — Encounter (HOSPITAL_COMMUNITY): Payer: Self-pay | Admitting: Family Medicine

## 2023-11-27 ENCOUNTER — Encounter (HOSPITAL_COMMUNITY)
Admission: RE | Disposition: A | Discharge status: Home / Self Care | Payer: Self-pay | Source: Home / Self Care | Attending: Family Medicine

## 2023-11-27 DIAGNOSIS — Z3A40 40 weeks gestation of pregnancy: Secondary | ICD-10-CM

## 2023-11-27 DIAGNOSIS — O3663X0 Maternal care for excessive fetal growth, third trimester, not applicable or unspecified: Secondary | ICD-10-CM

## 2023-11-27 DIAGNOSIS — O4423 Partial placenta previa NOS or without hemorrhage, third trimester: Secondary | ICD-10-CM

## 2023-11-27 LAB — COMPREHENSIVE METABOLIC PANEL WITH GFR
ALT: 16 U/L (ref 0–44)
AST: 25 U/L (ref 15–41)
Albumin: 2.4 g/dL — ABNORMAL LOW (ref 3.5–5.0)
Alkaline Phosphatase: 145 U/L — ABNORMAL HIGH (ref 38–126)
Anion gap: 11 (ref 5–15)
BUN: 6 mg/dL (ref 6–20)
CO2: 20 mmol/L — ABNORMAL LOW (ref 22–32)
Calcium: 8.3 mg/dL — ABNORMAL LOW (ref 8.9–10.3)
Chloride: 102 mmol/L (ref 98–111)
Creatinine, Ser: 0.76 mg/dL (ref 0.44–1.00)
GFR, Estimated: >60 mL/min (ref >60)
Glucose, Bld: 72 mg/dL (ref 70–99)
Potassium: 3.5 mmol/L (ref 3.5–5.1)
Sodium: 133 mmol/L — ABNORMAL LOW (ref 135–145)
Total Bilirubin: 0.3 mg/dL (ref 0.0–1.2)
Total Protein: 6.4 g/dL — ABNORMAL LOW (ref 6.5–8.1)

## 2023-11-27 LAB — PROTEIN / CREATININE RATIO, URINE
Creatinine, Urine: 33 mg/dL
Total Protein, Urine: <6 mg/dL

## 2023-11-27 LAB — CBC WITH DIFFERENTIAL/PLATELET
Abs Immature Granulocytes: 0.13 K/uL — ABNORMAL HIGH (ref 0.00–0.07)
Basophils Absolute: 0.1 K/uL (ref 0.0–0.1)
Basophils Relative: 0 %
Eosinophils Absolute: 0.1 K/uL (ref 0.0–0.5)
Eosinophils Relative: 1 %
HCT: 34.3 % — ABNORMAL LOW (ref 36.0–46.0)
Hemoglobin: 10.9 g/dL — ABNORMAL LOW (ref 12.0–15.0)
Immature Granulocytes: 1 %
Lymphocytes Relative: 26 %
Lymphs Abs: 3.8 K/uL (ref 0.7–4.0)
MCH: 25.6 pg — ABNORMAL LOW (ref 26.0–34.0)
MCHC: 31.8 g/dL (ref 30.0–36.0)
MCV: 80.5 fL (ref 80.0–100.0)
Monocytes Absolute: 1 K/uL (ref 0.1–1.0)
Monocytes Relative: 7 %
Neutro Abs: 9.5 K/uL — ABNORMAL HIGH (ref 1.7–7.7)
Neutrophils Relative %: 65 %
Platelets: 328 K/uL (ref 150–400)
RBC: 4.26 MIL/uL (ref 3.87–5.11)
RDW: 16.3 % — ABNORMAL HIGH (ref 11.5–15.5)
WBC: 14.6 K/uL — ABNORMAL HIGH (ref 4.0–10.5)
nRBC: 0 % (ref 0.0–0.2)

## 2023-11-27 LAB — CBC
HCT: 31.5 % — ABNORMAL LOW (ref 36.0–46.0)
Hemoglobin: 10.2 g/dL — ABNORMAL LOW (ref 12.0–15.0)
MCH: 25.6 pg — ABNORMAL LOW (ref 26.0–34.0)
MCHC: 32.4 g/dL (ref 30.0–36.0)
MCV: 78.9 fL — ABNORMAL LOW (ref 80.0–100.0)
Platelets: 288 K/uL (ref 150–400)
RBC: 3.99 MIL/uL (ref 3.87–5.11)
RDW: 16.3 % — ABNORMAL HIGH (ref 11.5–15.5)
WBC: 23.1 K/uL — ABNORMAL HIGH (ref 4.0–10.5)
nRBC: 0 % (ref 0.0–0.2)

## 2023-11-27 SURGERY — Surgical Case
Anesthesia: Epidural | Site: Abdomen

## 2023-11-27 MED ORDER — LIDOCAINE-EPINEPHRINE (PF) 2 %-1:200000 IJ SOLN
INTRAMUSCULAR | Status: DC | PRN
  Administered 2023-11-26: 3 mL via EPIDURAL

## 2023-11-27 MED ORDER — TRANEXAMIC ACID-NACL 1000-0.7 MG/100ML-% IV SOLN
INTRAVENOUS | Status: DC | PRN
  Administered 2023-11-27: 1000 mg via INTRAVENOUS

## 2023-11-27 MED ORDER — SCOPOLAMINE 1 MG/3DAYS TD PT72
1.0000 | MEDICATED_PATCH | Freq: Once | TRANSDERMAL | Status: DC
  Administered 2023-11-27: 1 mg via TRANSDERMAL
  Filled 2023-11-27: qty 1

## 2023-11-27 MED ORDER — DIPHENHYDRAMINE HCL 25 MG PO CAPS: 25.0000 mg | ORAL_CAPSULE | Freq: Four times a day (QID) | ORAL | Status: DC | PRN

## 2023-11-27 MED ORDER — STERILE WATER FOR IRRIGATION IR SOLN
Status: DC | PRN
  Administered 2023-11-27: 1

## 2023-11-27 MED ORDER — OXYCODONE HCL 5 MG PO TABS: 5.0000 mg | ORAL_TABLET | Freq: Once | ORAL | Status: DC | PRN

## 2023-11-27 MED ORDER — HYDROMORPHONE HCL 2 MG PO TABS: 2.0000 mg | ORAL_TABLET | ORAL | Status: DC | PRN

## 2023-11-27 MED ORDER — SODIUM CHLORIDE 0.9 % IV SOLN
2.0000 g | Freq: Four times a day (QID) | INTRAVENOUS | Status: AC
  Administered 2023-11-27 – 2023-11-28 (×3): 2 g via INTRAVENOUS
  Filled 2023-11-27 (×3): qty 2000

## 2023-11-27 MED ORDER — IBUPROFEN 600 MG PO TABS
600.0000 mg | ORAL_TABLET | Freq: Four times a day (QID) | ORAL | Status: DC
  Administered 2023-11-28 – 2023-11-30 (×5): 600 mg via ORAL
  Filled 2023-11-27 (×7): qty 1

## 2023-11-27 MED ORDER — NALOXONE HCL 0.4 MG/ML IJ SOLN: 0.4000 mg | INTRAMUSCULAR | Status: DC | PRN

## 2023-11-27 MED ORDER — MEASLES, MUMPS & RUBELLA VAC ~~LOC~~ SUSR: 0.5000 mL | Freq: Once | SUBCUTANEOUS | Status: DC

## 2023-11-27 MED ORDER — FENTANYL CITRATE (PF) 100 MCG/2ML IJ SOLN
INTRAMUSCULAR | Status: AC
  Filled 2023-11-27: qty 2

## 2023-11-27 MED ORDER — FENTANYL CITRATE (PF) 100 MCG/2ML IJ SOLN
INTRAMUSCULAR | Status: DC | PRN
  Administered 2023-11-27: 100 ug via EPIDURAL

## 2023-11-27 MED ORDER — ACETAMINOPHEN 10 MG/ML IV SOLN
1000.0000 mg | Freq: Once | INTRAVENOUS | Status: DC | PRN
  Administered 2023-11-27: 1000 mg via INTRAVENOUS

## 2023-11-27 MED ORDER — DEXAMETHASONE SOD PHOSPHATE PF 10 MG/ML IJ SOLN
INTRAMUSCULAR | Status: DC | PRN
  Administered 2023-11-27 (×2): 5 mg via INTRAVENOUS

## 2023-11-27 MED ORDER — KETOROLAC TROMETHAMINE 30 MG/ML IJ SOLN: 30.0000 mg | Freq: Once | INTRAMUSCULAR | Status: DC | PRN

## 2023-11-27 MED ORDER — FENTANYL CITRATE (PF) 100 MCG/2ML IJ SOLN: 25.0000 ug | INTRAMUSCULAR | Status: DC | PRN

## 2023-11-27 MED ORDER — NALOXONE HCL 4 MG/10ML IJ SOLN: 1.0000 ug/kg/h | INTRAVENOUS | Status: DC | PRN

## 2023-11-27 MED ORDER — FUROSEMIDE 20 MG PO TABS
20.0000 mg | ORAL_TABLET | Freq: Every day | ORAL | Status: DC
  Administered 2023-11-27 – 2023-11-30 (×4): 20 mg via ORAL
  Filled 2023-11-27 (×4): qty 1

## 2023-11-27 MED ORDER — OXYTOCIN-SODIUM CHLORIDE 30-0.9 UT/500ML-% IV SOLN
INTRAVENOUS | Status: DC | PRN
  Administered 2023-11-27: 300 mL via INTRAVENOUS

## 2023-11-27 MED ORDER — SODIUM CHLORIDE 0.9% FLUSH: 3.0000 mL | INTRAVENOUS | Status: DC | PRN

## 2023-11-27 MED ORDER — MAGNESIUM HYDROXIDE 400 MG/5ML PO SUSP: 30.0000 mL | ORAL | Status: DC | PRN

## 2023-11-27 MED ORDER — COCONUT OIL OIL
1.0000 | TOPICAL_OIL | Status: DC | PRN
  Administered 2023-11-29: 1 via TOPICAL

## 2023-11-27 MED ORDER — OXYCODONE HCL 5 MG PO TABS: 5.0000 mg | ORAL_TABLET | ORAL | Status: DC | PRN

## 2023-11-27 MED ORDER — AMISULPRIDE (ANTIEMETIC) 5 MG/2ML IV SOLN: 10.0000 mg | Freq: Once | INTRAVENOUS | Status: DC | PRN

## 2023-11-27 MED ORDER — ONDANSETRON HCL 4 MG/2ML IJ SOLN
INTRAMUSCULAR | Status: AC
  Filled 2023-11-27: qty 2

## 2023-11-27 MED ORDER — SODIUM CHLORIDE 0.9 % IV SOLN
INTRAVENOUS | Status: DC | PRN
  Administered 2023-11-26: 6 mL via EPIDURAL

## 2023-11-27 MED ORDER — MENTHOL 3 MG MT LOZG: 1.0000 | LOZENGE | OROMUCOSAL | Status: DC | PRN

## 2023-11-27 MED ORDER — ACETAMINOPHEN 500 MG PO TABS
1000.0000 mg | ORAL_TABLET | Freq: Four times a day (QID) | ORAL | Status: DC
  Administered 2023-11-27 – 2023-11-30 (×9): 1000 mg via ORAL
  Filled 2023-11-27 (×11): qty 2

## 2023-11-27 MED ORDER — SENNOSIDES-DOCUSATE SODIUM 8.6-50 MG PO TABS
2.0000 | ORAL_TABLET | Freq: Every day | ORAL | Status: DC
  Administered 2023-11-28: 2 via ORAL
  Filled 2023-11-27 (×3): qty 2

## 2023-11-27 MED ORDER — SODIUM CHLORIDE 0.9 % IR SOLN
Status: DC | PRN
  Administered 2023-11-27: 1

## 2023-11-27 MED ORDER — ENOXAPARIN SODIUM 40 MG/0.4ML IJ SOSY
40.0000 mg | PREFILLED_SYRINGE | INTRAMUSCULAR | Status: DC
Start: 2023-11-28 — End: 2023-11-30
  Administered 2023-11-28 – 2023-11-30 (×3): 40 mg via SUBCUTANEOUS
  Filled 2023-11-27 (×3): qty 0.4

## 2023-11-27 MED ORDER — MORPHINE SULFATE (PF) 0.5 MG/ML IJ SOLN
INTRAMUSCULAR | Status: DC | PRN
  Administered 2023-11-27: 3 mg via EPIDURAL

## 2023-11-27 MED ORDER — KETOROLAC TROMETHAMINE 30 MG/ML IJ SOLN
30.0000 mg | Freq: Four times a day (QID) | INTRAMUSCULAR | Status: AC
  Administered 2023-11-27 – 2023-11-28 (×4): 30 mg via INTRAVENOUS
  Filled 2023-11-27 (×4): qty 1

## 2023-11-27 MED ORDER — LIDOCAINE HCL (PF) 1 % IJ SOLN
INTRAMUSCULAR | Status: DC | PRN
  Administered 2023-11-26: 3 mL via SUBCUTANEOUS
  Administered 2023-11-26: 2 mL via SUBCUTANEOUS

## 2023-11-27 MED ORDER — MORPHINE SULFATE (PF) 0.5 MG/ML IJ SOLN
INTRAMUSCULAR | Status: AC
  Filled 2023-11-27: qty 10

## 2023-11-27 MED ORDER — LACTATED RINGERS IV BOLUS
500.0000 mL | Freq: Three times a day (TID) | INTRAVENOUS | Status: DC | PRN
  Administered 2023-11-27: 500 mL via INTRAVENOUS
  Administered 2023-11-27: 1000 mL via INTRAVENOUS

## 2023-11-27 MED ORDER — PRENATAL MULTIVITAMIN CH
1.0000 | ORAL_TABLET | Freq: Every day | ORAL | Status: DC
  Administered 2023-11-29: 1 via ORAL
  Filled 2023-11-27 (×3): qty 1

## 2023-11-27 MED ORDER — GENTAMICIN SULFATE 40 MG/ML IJ SOLN
5.0000 mg/kg | Freq: Once | INTRAVENOUS | Status: AC
  Administered 2023-11-27: 340 mg via INTRAVENOUS
  Filled 2023-11-27: qty 8.5

## 2023-11-27 MED ORDER — ONDANSETRON HCL 4 MG/2ML IJ SOLN: 4.0000 mg | Freq: Three times a day (TID) | INTRAMUSCULAR | Status: DC | PRN

## 2023-11-27 MED ORDER — DIBUCAINE (PERIANAL) 1 % EX OINT: 1.0000 | TOPICAL_OINTMENT | CUTANEOUS | Status: DC | PRN

## 2023-11-27 MED ORDER — CEFAZOLIN SODIUM-DEXTROSE 2-3 GM-%(50ML) IV SOLR
INTRAVENOUS | Status: DC | PRN
  Administered 2023-11-27: 2 g via INTRAVENOUS

## 2023-11-27 MED ORDER — DIPHENHYDRAMINE HCL 50 MG/ML IJ SOLN: 12.5000 mg | Freq: Four times a day (QID) | INTRAMUSCULAR | Status: DC | PRN

## 2023-11-27 MED ORDER — POTASSIUM CHLORIDE CRYS ER 20 MEQ PO TBCR
20.0000 meq | EXTENDED_RELEASE_TABLET | Freq: Every day | ORAL | Status: DC
  Administered 2023-11-27 – 2023-11-30 (×4): 20 meq via ORAL
  Filled 2023-11-27 (×4): qty 1

## 2023-11-27 MED ORDER — MEPERIDINE HCL 25 MG/ML IJ SOLN: 6.2500 mg | INTRAMUSCULAR | Status: DC | PRN

## 2023-11-27 MED ORDER — WITCH HAZEL-GLYCERIN EX PADS: 1.0000 | MEDICATED_PAD | CUTANEOUS | Status: DC | PRN

## 2023-11-27 MED ORDER — SODIUM BICARBONATE 8.4 % IV SOLN
INTRAVENOUS | Status: DC | PRN
  Administered 2023-11-27: 2 mL via EPIDURAL
  Administered 2023-11-27 (×2): 5 mL via EPIDURAL

## 2023-11-27 MED ORDER — OXYCODONE HCL 5 MG/5ML PO SOLN: 5.0000 mg | Freq: Once | ORAL | Status: DC | PRN

## 2023-11-27 MED ORDER — SODIUM CHLORIDE 0.9 % IV SOLN: INTRAVENOUS | Status: DC | PRN

## 2023-11-27 MED ORDER — ACETAMINOPHEN 500 MG PO TABS
1000.0000 mg | ORAL_TABLET | Freq: Once | ORAL | Status: AC
  Administered 2023-11-27: 1000 mg via ORAL
  Filled 2023-11-27: qty 2

## 2023-11-27 MED ORDER — OXYTOCIN-SODIUM CHLORIDE 30-0.9 UT/500ML-% IV SOLN: 2.5000 [IU]/h | INTRAVENOUS | Status: AC

## 2023-11-27 MED ORDER — AZITHROMYCIN 500 MG IV SOLR
INTRAVENOUS | Status: DC | PRN
  Administered 2023-11-27: 500 mg via INTRAVENOUS

## 2023-11-27 MED ORDER — GABAPENTIN 300 MG PO CAPS
300.0000 mg | ORAL_CAPSULE | Freq: Two times a day (BID) | ORAL | Status: DC
  Administered 2023-11-27 – 2023-11-30 (×6): 300 mg via ORAL
  Filled 2023-11-27 (×6): qty 1

## 2023-11-27 MED ORDER — LEVOTHYROXINE SODIUM 75 MCG PO TABS
175.0000 ug | ORAL_TABLET | Freq: Every day | ORAL | Status: DC
  Administered 2023-11-28 – 2023-11-30 (×3): 175 ug via ORAL
  Filled 2023-11-27 (×4): qty 1

## 2023-11-27 MED ORDER — LACTATED RINGERS IV SOLN: INTRAVENOUS | Status: DC

## 2023-11-27 MED ORDER — ZOLPIDEM TARTRATE 5 MG PO TABS: 5.0000 mg | ORAL_TABLET | Freq: Every evening | ORAL | Status: DC | PRN

## 2023-11-27 MED ORDER — FERROUS SULFATE 325 (65 FE) MG PO TABS
325.0000 mg | ORAL_TABLET | ORAL | Status: DC
Start: 2023-11-28 — End: 2023-11-30
  Administered 2023-11-28 – 2023-11-30 (×2): 325 mg via ORAL
  Filled 2023-11-27 (×2): qty 1

## 2023-11-27 MED ORDER — SIMETHICONE 80 MG PO CHEW: 80.0000 mg | CHEWABLE_TABLET | ORAL | Status: DC | PRN

## 2023-11-27 SURGICAL SUPPLY — 31 items
BENZOIN TINCTURE PRP APPL 2/3 (GAUZE/BANDAGES/DRESSINGS) IMPLANT
CHLORAPREP W/TINT 26 (MISCELLANEOUS) ×2 IMPLANT
CLAMP UMBILICAL CORD (MISCELLANEOUS) ×1 IMPLANT
CLOTH BEACON ORANGE TIMEOUT ST (SAFETY) ×1 IMPLANT
DRSG OPSITE POSTOP 4X10 (GAUZE/BANDAGES/DRESSINGS) ×1 IMPLANT
ELECTRODE REM PT RTRN 9FT ADLT (ELECTROSURGICAL) ×1 IMPLANT
EXTRACTOR VACUUM M CUP 4 TUBE (SUCTIONS) IMPLANT
GAUZE PAD ABD 7.5X8 STRL (GAUZE/BANDAGES/DRESSINGS) IMPLANT
GAUZE SPONGE 4X4 12PLY STRL LF (GAUZE/BANDAGES/DRESSINGS) IMPLANT
GLOVE BIO SURGEON STRL SZ7.5 (GLOVE) ×1 IMPLANT
GLOVE BIOGEL PI IND STRL 7.0 (GLOVE) ×2 IMPLANT
GLOVE BIOGEL PI IND STRL 8 (GLOVE) ×1 IMPLANT
GOWN STRL REUS W/TWL LRG LVL3 (GOWN DISPOSABLE) ×2 IMPLANT
GOWN STRL REUS W/TWL XL LVL3 (GOWN DISPOSABLE) ×1 IMPLANT
KIT ABG SYR 3ML LUER SLIP (SYRINGE) IMPLANT
NDL HYPO 25X5/8 SAFETYGLIDE (NEEDLE) IMPLANT
NEEDLE HYPO 25X5/8 SAFETYGLIDE (NEEDLE) IMPLANT
NS IRRIG 1000ML POUR BTL (IV SOLUTION) ×1 IMPLANT
PACK C SECTION WH (CUSTOM PROCEDURE TRAY) ×1 IMPLANT
PAD OB MATERNITY 4.3X12.25 (PERSONAL CARE ITEMS) ×1 IMPLANT
RTRCTR C-SECT PINK 25CM LRG (MISCELLANEOUS) ×1 IMPLANT
STRIP CLOSURE SKIN 1/2X4 (GAUZE/BANDAGES/DRESSINGS) IMPLANT
SUT MNCRL 0 VIOLET CTX 36 (SUTURE) ×2 IMPLANT
SUT PLAIN ABS 2-0 CT1 27XMFL (SUTURE) IMPLANT
SUT VIC AB 0 CTX36XBRD ANBCTRL (SUTURE) ×1 IMPLANT
SUT VIC AB 2-0 CT1 TAPERPNT 27 (SUTURE) ×1 IMPLANT
SUT VIC AB 4-0 KS 27 (SUTURE) ×1 IMPLANT
TAPE CLOTH SURG 4X10 WHT LF (GAUZE/BANDAGES/DRESSINGS) IMPLANT
TOWEL OR 17X24 6PK STRL BLUE (TOWEL DISPOSABLE) ×1 IMPLANT
TRAY FOLEY W/BAG SLVR 14FR LF (SET/KITS/TRAYS/PACK) ×1 IMPLANT
WATER STERILE IRR 1000ML POUR (IV SOLUTION) ×1 IMPLANT

## 2023-11-27 NOTE — Lactation Note (Signed)
 This note was copied from a baby's chart. Lactation Consultation Note  Patient Name: Linda Edwards Date: 11/27/2023 Age:37 hours Reason for consult: Initial assessment;1st time breastfeeding;Term (C/S delivery).  P1, term female infant, MOB attempted latch infant on her right breast using the football hold position, infant did elicit the suck swallow response only held nipple in her mouth. Afterwards MOB was taught hand expression and infant was given 6 mls of colostrum by spoon, MOB will continue to work towards latching infant at the breast and knows to continue to ask for latch assistance. LC discussed hunger cues and signs of satiety in infant. MOB will continue to breastfeed infant by cues, on demand, 8-12 times within 24 hours, skin to skin.  MOB knows to continue to hand express on Day 1 if infant does not latch. LC discussed the importance of maternal rest, meals and hydration. MOB was made aware of O/P services, breastfeeding support groups, community resources, and our phone # for post-discharge questions.    MOB does have DEBP at home.   Maternal Data Has patient been taught Hand Expression?: Yes Does the patient have breastfeeding experience prior to this delivery?: No  Feeding Mother's Current Feeding Choice: Breast Milk  LATCH Score Latch: Too sleepy or reluctant, no latch achieved, no sucking elicited.  Audible Swallowing: None  Type of Nipple: Everted at rest and after stimulation  Comfort (Breast/Nipple): Soft / non-tender  Hold (Positioning): Assistance needed to correctly position infant at breast and maintain latch.  LATCH Score: 5   Lactation Tools Discussed/Used    Interventions Interventions: Breast feeding basics reviewed;Assisted with latch;Skin to skin;Hand express;Breast compression;Adjust position;Support pillows;Position options;Expressed milk;Education;CDC milk storage guidelines;CDC Guidelines for Breast Pump Cleaning;LC Services  brochure;Guidelines for Milk Supply and Pumping Schedule Handout  Discharge Pump: DEBP;Personal  Consult Status Consult Status: Follow-up Date: 11/28/23 Follow-up type: In-patient    Grayce LULLA Batter 11/27/2023, 7:56 PM

## 2023-11-27 NOTE — Progress Notes (Signed)
 Labor Progress Note Linda Edwards is a 38 y.o. G1P0000 at [redacted]w[redacted]d presenting for IOL for LGA & marginal cord, now with gHTN  S: Pt comfortable with epidural  O:  BP 128/71   Pulse 71   Temp 98.6 F (37 C) (Oral)   Resp 18   Ht 5' 3 (1.6 m)   Wt 93.4 kg   LMP 02/03/2023   SpO2 100%   BMI 36.49 kg/m  Lab Results  Component Value Date   HGB 10.9 (L) 11/26/2023    Time: 2:45 AM  FHT: baseline bpm 130, moderate variability, accelerations present, decelerations early,   Contractions: q every 1-3 min mins,    CVE: Dilation: 4.5 Effacement (%): 100 Cervical Position: Posterior Station: -2 Presentation: Vertex Exam by:: Dr Trudy   A&P: 38 y.o. G1P0000 [redacted]w[redacted]d IOL for LGA, marginal cord, now with gHTN during labor #Labor: Latent Labor AROM, Pitocin, Cytotec, and IP Foley  #Pain: Epidural #FWB: Category I #GBS negative #gHTN - new onset in labor--CBC/CMP unremarkable, PCR pending  Leeroy KATHEE Trudy, MD 2:45 AM

## 2023-11-27 NOTE — Transfer of Care (Signed)
 Immediate Anesthesia Transfer of Care Note  Patient: Linda Edwards  Procedure(s) Performed: CESAREAN DELIVERY (Abdomen)  Patient Location: PACU  Anesthesia Type:Epidural  Level of Consciousness: awake  Airway & Oxygen Therapy: Patient Spontanous Breathing  Post-op Assessment: Report given to RN  Post vital signs: Reviewed and stable  Last Vitals:  Vitals Value Taken Time  BP    Temp    Pulse    Resp    SpO2      Last Pain:  Vitals:   11/27/23 1347  TempSrc: Oral  PainSc:          Complications: No notable events documented.

## 2023-11-27 NOTE — Anesthesia Procedure Notes (Signed)
 Epidural Patient location during procedure: OB Start time: 11/26/2023 11:28 PM End time: 11/26/2023 11:35 PM  Staffing Anesthesiologist: Boone Fess, MD Performed: anesthesiologist   Preanesthetic Checklist Completed: patient identified, IV checked, site marked, risks and benefits discussed, surgical consent, monitors and equipment checked, pre-op evaluation and timeout performed  Epidural Patient position: sitting Prep: ChloraPrep Patient monitoring: heart rate, continuous pulse ox and blood pressure Approach: midline Location: L2-L3 Injection technique: LOR saline  Needle:  Needle type: Tuohy  Needle gauge: 17 G Needle length: 9 cm Needle insertion depth: 7 cm Catheter type: closed end flexible Catheter size: 19 Gauge Catheter at skin depth: 12 cm Test dose: negative and 1.5% lidocaine  with Epi 1:200 K  Assessment Sensory level: T10 Events: blood not aspirated, no cerebrospinal fluid, injection not painful, no injection resistance, no paresthesia and negative IV test  Additional Notes Two attempts/skin entry sites.  Challenging due to visible scoliosis.  Pt. Evaluated and documentation done after procedure finished. Patient identified. Risks/Benefits/Options discussed with patient including but not limited to bleeding, infection, nerve damage, paralysis, failed block, incomplete pain control, headache, blood pressure changes, nausea, vomiting, reactions to medication both or allergic, itching and postpartum back pain. Confirmed with bedside nurse the patient's most recent platelet count. Confirmed with patient that they are not currently taking any anticoagulation, have any bleeding history or any family history of bleeding disorders. Patient expressed understanding and wished to proceed. All questions were answered. Sterile technique was used throughout the entire procedure. Please see nursing notes for vital signs. Test dose was given through epidural catheter and negative  prior to continuing to dose epidural or start infusion. Warning signs of high block given to the patient including shortness of breath, tingling/numbness in hands, complete motor block, or any concerning symptoms with instructions to call for help. Patient was given instructions on fall risk and not to get out of bed. All questions and concerns addressed with instructions to call with any issues or inadequate analgesia.     Patient tolerated the insertion well without immediate complications.  Reason for block: procedure for painReason for block:procedure for pain

## 2023-11-27 NOTE — Progress Notes (Signed)
  Patient has been complete/100/-1 since 1015. She has pushed with good maternal effort for >2.5 hours with no descent noted of fetal head. While patient was laboring down, noted to have prolonged decelerations with contractions so pitocin stopped at that time. She had fever of 101.5 at 0922 and treatment started for suspected triple I at that time. Patient states that she is exhausted and doesn't feel that she can push the baby any further.    The risks of cesarean section discussed with the patient included but were not limited to: bleeding which may require transfusion or reoperation; infection which may require antibiotics; injury to bowel, bladder, ureters or other surrounding organs; injury to the fetus; need for additional procedures including hysterectomy in the event of a life-threatening hemorrhage; placental abnormalities with subsequent pregnancies, incisional problems, thromboembolic phenomenon and other postoperative/anesthesia complications. The patient concurred with the proposed plan, giving informed written consent for the procedure. Patient has been NPO since last night she will remain NPO for procedure. Anesthesia and OR aware. Preoperative prophylactic antibiotics and SCDs ordered on call to the OR.

## 2023-11-27 NOTE — Progress Notes (Signed)
 Labor Progress Note Jenessa Gillingham is a 38 y.o. G1P0000 at [redacted]w[redacted]d presenting for IOL for LGA and marginal cord, now with gHTN  S: Patient feeling pressure with contractions and pressure in rectum  O:  BP 125/83   Pulse 72   Temp 98.9 F (37.2 C) (Axillary)   Resp 18   Ht 5' 3 (1.6 m)   Wt 93.4 kg   LMP 02/03/2023   SpO2 99%   BMI 36.49 kg/m  Lab Results  Component Value Date   HGB 10.9 (L) 11/26/2023    Time: 6:51 AM  FHT: baseline bpm 135, moderate variability, accelerations present, decelerations none,   Contractions: q 2-3 mins,    CVE: 9.5/80/-1 (thick rim of cervix anterior and maternal left)   A&P: 38 y.o. G1P0000 [redacted]w[redacted]d  #Labor: Active Labor AROM, Pitocin, Cytotec, and IP Foley  #Pain: Epidural #FWB: Category I #GBS negative   [name], MD 6:51 AM

## 2023-11-27 NOTE — Op Note (Signed)
 Cesarean Section Operative Note   Patient: Linda Edwards  Date of Procedure: 11/27/2023  Procedure: Primary Low Transverse Cesarean   Indications: failure to progress: arrest of descent  Pre-operative Diagnosis: Arrest of descent.   Post-operative Diagnosis: Same  TOLAC Candidate: Yes   Surgeon: Surgeons and Role:    * Ilean Norleen GAILS, MD - Primary  Assistants: Teresa Nicodemus L  An experienced assistant was required given the standard of surgical care given the complexity of the case.  This assistant was needed for exposure, dissection, suctioning, retraction, instrument exchange, assisting with delivery with administration of fundal pressure, and for overall help during the procedure.   Anesthesia: epidural  Anesthesiologist: No responsible provider has been recorded for the case.   Antibiotics: Cefazolin  and Azithromycin   Estimated Blood Loss: 638 ml   Total IV Fluids: 900 ml  Urine Output: 50 cc OF rose colored urine  Specimens: N/A   Complications: no complications   Indications: Linda Edwards is a 38 y.o. G1P1001 with an IUP [redacted]w[redacted]d presenting for unscheduled, urgent cesarean secondary to the indications listed above.   The risks of cesarean section discussed with the patient included but were not limited to: bleeding which may require transfusion or reoperation; infection which may require antibiotics; injury to bowel, bladder, ureters or other surrounding organs; injury to the fetus; need for additional procedures including hysterectomy in the event of a life-threatening hemorrhage; placental abnormalities with subsequent pregnancies, incisional problems, thromboembolic phenomenon and other postoperative/anesthesia complications. The patient concurred with the proposed plan, giving informed written consent for the procedure. Patient has been NPO since last night she will remain NPO for procedure. Anesthesia and OR aware. Preoperative prophylactic antibiotics and SCDs  ordered on call to the OR.   Findings: Viable infant in cephalic presentation, no nuchal cord present. Apgars 8, 9, . Weight 4020 g. Heavy meconium stained amniotic fluid. Normal placenta, three vessel cord. Normal uterus, Normal bilateral fallopian tubes, Normal bilateral ovaries. No adhesive disease was encountered.  Procedure Details: A Time Out was held and the above information confirmed. The patient received intravenous antibiotics and had sequential compression devices applied to her lower extremities preoperatively. The patient was taken back to the operative suite where epidural anesthesia was administered. After induction of anesthesia, the patient was draped and prepped in the usual sterile manner and placed in a dorsal supine position with a leftward tilt. A low transverse skin incision was made with scalpel and carried down through the subcutaneous tissue to the fascia. Fascial incision was made and extended transversely. The fascia was separated from the underlying rectus tissue superiorly and inferiorly. The rectus muscles were separated in the midline bluntly and the peritoneum was entered bluntly. An Alexis retractor was placed to aid in visualization of the uterus. The utero-vesical peritoneal reflection was incised transversely and the bladder flap was bluntly freed from the lower uterine segment. A low transverse uterine incision was made. The infant was successfully delivered from cephalic presentation using Kiwi vacuum, the umbilical cord was clamped immediately. Cord ph was sent, and cord blood was obtained for evaluation. The placenta was removed Intact and appeared normal. The uterine incision was closed with a single layer running unlocked suture of 0-Monocryl. Due to ongoing bleeding a second layer of 0 Monocryl was placed in an imbricating fashion, after which there was excellent hemostasis. The abdomen and the pelvis were cleared of all clot and debris and the Thersia was removed.  Hemostasis was confirmed on all surfaces.  The peritoneum  was reapproximated using 2-0 vicryl . The fascia was then closed using 0 Vicryl in a running fashion. The subcutaneous layer was reapproximated with 2-0 plain gut suture. The skin was closed with a 4-0 vicryl subcuticular stitch. The patient tolerated the procedure well. Sponge, lap, instrument and needle counts were correct x 2. She was taken to the recovery room in stable condition.  Disposition: PACU - hemodynamically stable.    Signed: Barkley Angles, MD OB Fellow, Faculty Practice [facility], [facility] for [employer]

## 2023-11-27 NOTE — Anesthesia Preprocedure Evaluation (Signed)
 Anesthesia Evaluation  Patient identified by MRN, date of birth, ID band Patient awake    Reviewed: Allergy & Precautions, NPO status , Patient's Chart, lab work & pertinent test results  History of Anesthesia Complications Negative for: history of anesthetic complications  Airway Mallampati: II  TM Distance: >3 FB Neck ROM: Full    Dental no notable dental hx. (+) Teeth Intact   Pulmonary neg pulmonary ROS, neg sleep apnea, neg COPD, Patient abstained from smoking.Not current smoker, former smoker   Pulmonary exam normal breath sounds clear to auscultation       Cardiovascular Exercise Tolerance: Good METS(-) hypertension(-) CAD and (-) Past MI negative cardio ROS (-) dysrhythmias  Rhythm:Regular Rate:Normal - Systolic murmurs    Neuro/Psych Hx scoliosis  Neuromuscular disease  negative psych ROS   GI/Hepatic ,neg GERD  ,,(+)     (-) substance abuse    Endo/Other  neg diabetesHypothyroidism    Renal/GU negative Renal ROS     Musculoskeletal   Abdominal  (+) + obese  Peds  Hematology Denies blood thinner use or bleeding disorders.    Anesthesia Other Findings Denies blood thinner use or bleeding diatheses. Recent labs reviewed. Past Medical History: 05/02/2023: Biological false positive RPR test     Comment:  Negative T. 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 ETTERthelbert 07/28/2016) 09/14/2021: Vitamin D  deficiency   Reproductive/Obstetrics (+) Pregnancy                              Anesthesia Physical Anesthesia Plan  ASA: 2  Anesthesia Plan: Epidural   Post-op Pain Management:    Induction:   PONV Risk Score and Plan: 2 and Treatment may vary due to age or  medical condition and Ondansetron   Airway Management Planned: Natural Airway  Additional Equipment:   Intra-op Plan:   Post-operative Plan:   Informed Consent: I have reviewed the patients History and Physical, chart, labs and discussed the procedure including the risks, benefits and alternatives for the proposed anesthesia with the patient or authorized representative who has indicated his/her understanding and acceptance.       Plan Discussed with: Surgeon  Anesthesia Plan Comments: (Discussed R/B/A of neuraxial anesthesia technique with patient: - rare risks of spinal/epidural hematoma, nerve damage, infection - Risk of PDPH - Risk of itching - Risk of nausea and vomiting - Risk of poor block necessitating replacement of epidural. - Risk of allergic reactions. Discussed the potential increased risk of PDPH, difficult epidural, poor analgesia in light of scoliosis. Patient voiced understanding.)         Anesthesia Quick Evaluation

## 2023-11-27 NOTE — Progress Notes (Signed)
 Labor Progress Note Linda Edwards is a 38 y.o. G1P0000 at [redacted]w[redacted]d presented for IOL for LGA and marginal cord.   S: Doing well.  O:  BP (!) 110/55   Pulse 82   Temp (!) 101.5 F (38.6 C) (Oral)   Resp 18   Ht 5' 3 (1.6 m)   Wt 93.4 kg   LMP 02/03/2023   SpO2 99%   BMI 36.49 kg/m  EFM: 150/Moderate Variability/Accelerations (+),Decelerations (-)  CVE: Dilation: Lip/rim Effacement (%): 80 Cervical Position: Posterior Station: -1 Presentation: Vertex Exam by:: Linda Pouch, MD   A&P: 38 y.o. G1P0000 [redacted]w[redacted]d  #Labor: AROM performed at 2221 on 10/27. Temperature of 101.5. Suspected Triple I. Tylenol  and fluid bolus for symptomatic relief. Will start ampicillin and gentamicin.  #Pain: Epidural in place #FWB: Category I #GBS negative #gHTN: New diagnosis, continue to monitor BP's.   Jamileth Putzier LITTIE Angles, MD 10:32 AM

## 2023-11-27 NOTE — Discharge Summary (Shared)
 Postpartum Discharge Summary  Date of Service updated***     Patient Name: Linda Edwards DOB: July 01, 1985 MRN: [id number]  Date of admission: 11/25/2023 Delivery date:11/27/2023 Delivering provider: ILEAN NORLEEN GAILS Date of discharge: 11/27/2023  Admitting diagnosis: Indication for care in labor or delivery [O75.9] Intrauterine pregnancy: [redacted]w[redacted]d     Secondary diagnosis:  Principal Problem:   Indication for care in labor or delivery  Additional problems: arrested of descent     Discharge diagnosis: Term Pregnancy Delivered                                              Post partum procedures:{Postpartum procedures:23558} Augmentation: AROM, Pitocin, Cytotec, and IP Foley Complications: None  Hospital course: Induction of Labor With Cesarean Section   38 y.o. yo G1P1001 at [redacted]w[redacted]d was admitted to the hospital 11/25/2023 for induction of labor. Patient had a labor course significant for slow labor progress resulted . The patient went for cesarean section due to Arrest of Descent. Delivery details are as follows: Membrane Rupture Time/Date: 10:13 PM,11/26/2023  Delivery Method:C-Section, Vacuum Assisted Operative Delivery:Device used:C-section/vacumn Indication: Maternal exhaustion and Prolonged second stage Details of operation can be found in separate operative Note.  Patient had a postpartum course complicated by***. She is ambulating, tolerating a regular diet, passing flatus, and urinating well.  Patient is discharged home in stable condition on 11/27/23.      Newborn Data: Birth date:11/27/2023 Birth time:4:19 PM Gender:Female Living status:Living Apgars:8 ,9  Weight:4020 g                               Magnesium Sulfate received: No BMZ received: No Rhophylac:{Rhophylac received:30440032} MMR:No T-DaP:Given prenatally Flu: No RSV Vaccine received: No Transfusion:No  Immunizations received: Immunization History  Administered Date(s) Administered   Tdap 10/08/2023     Physical exam  Vitals:   11/27/23 1730 11/27/23 1740 11/27/23 1745 11/27/23 1755  BP: 133/74  131/78   Pulse: 78 80 75 76  Resp: (!) 23 17 20  (!) 22  Temp:      TempSrc:      SpO2:  97% 96% 96%  Weight:      Height:       General: {Exam; general:21111117} Lochia: {Desc; appropriate/inappropriate:30686::appropriate} Uterine Fundus: {Desc; firm/soft:30687} Incision: {Exam; incision:21111123} DVT Evaluation: {Exam; dvt:2111122} Labs: Lab Results  Component Value Date   WBC 14.6 (H) 11/26/2023   HGB 10.9 (L) 11/26/2023   HCT 34.3 (L) 11/26/2023   MCV 80.5 11/26/2023   PLT 328 11/26/2023      Latest Ref Rng & Units 11/26/2023   11:51 PM  CMP  Glucose 70 - 99 mg/dL 72   BUN 6 - 20 mg/dL 6   Creatinine 9.55 - 8.99 mg/dL 9.23   Sodium 864 - 854 mmol/L 133   Potassium 3.5 - 5.1 mmol/L 3.5   Chloride 98 - 111 mmol/L 102   CO2 22 - 32 mmol/L 20   Calcium  8.9 - 10.3 mg/dL 8.3   Total Protein 6.5 - 8.1 g/dL 6.4   Total Bilirubin 0.0 - 1.2 mg/dL 0.3   Alkaline Phos 38 - 126 U/L 145   AST 15 - 41 U/L 25   ALT 0 - 44 U/L 16    Edinburgh Score:     No data to display  No data recorded  After visit meds:  Allergies as of 11/27/2023       Reactions   Other Diarrhea   Penicillin      Med Rec must be completed prior to using this [facility]***        Discharge home in stable condition Infant Feeding: Breast Infant Disposition:{CHL IP OB HOME WITH FNUYZM:76418} Discharge instruction: per After Visit Summary and Postpartum booklet. Activity: Advance as tolerated. Pelvic rest for 6 weeks.  Diet: routine diet Future Appointments:No future appointments. Follow up Visit:   Please schedule this patient for a In person postpartum visit in 6 weeks with the following provider: Any provider. Additional Postpartum F/U:Incision check 1 week BP check in 1 week High risk pregnancy complicated by: HTN Delivery mode:  C-Section, Vacuum Assisted Anticipated  Birth Control:  POPs Msg sent to [facility] [place] SUPPORT POOL    11/27/2023 Houston Samuels, DO

## 2023-11-28 ENCOUNTER — Encounter (HOSPITAL_COMMUNITY): Payer: Self-pay | Admitting: Family Medicine

## 2023-11-28 LAB — CBC
HCT: 28.4 % — ABNORMAL LOW (ref 36.0–46.0)
Hemoglobin: 9.2 g/dL — ABNORMAL LOW (ref 12.0–15.0)
MCH: 25.6 pg — ABNORMAL LOW (ref 26.0–34.0)
MCHC: 32.4 g/dL (ref 30.0–36.0)
MCV: 79.1 fL — ABNORMAL LOW (ref 80.0–100.0)
Platelets: 294 K/uL (ref 150–400)
RBC: 3.59 MIL/uL — ABNORMAL LOW (ref 3.87–5.11)
RDW: 16.3 % — ABNORMAL HIGH (ref 11.5–15.5)
WBC: 21.8 K/uL — ABNORMAL HIGH (ref 4.0–10.5)
nRBC: 0 % (ref 0.0–0.2)

## 2023-11-28 NOTE — Progress Notes (Signed)
 POSTPARTUM PROGRESS NOTE  Post Operative Day 1  Subjective:  Linda Edwards is a 38 y.o. G1P1001 s/p pLTCS at [redacted]w[redacted]d.  She reports she is doing well. She has been afebrile. No acute events overnight. She denies any problems with ambulating. Foley catheter just out--has not voided yet. No problems with po intake. Denies nausea or vomiting.  Pain is well controlled.  Lochia is Normal.  Objective: Blood pressure 137/65, pulse 76, temperature 98.4 F (36.9 C), temperature source Oral, resp. rate 18, height 5' 3 (1.6 m), weight 93.4 kg, last menstrual period 02/03/2023, SpO2 98%, unknown if currently breastfeeding.  BP Readings from Last 3 Encounters:  11/27/23 137/65  11/22/23 130/88  11/15/23 132/83    Physical Exam:  General: alert, cooperative and no distress Chest: no respiratory distress Heart:regular rate, distal pulses intact Uterine Fundus: firm, appropriately tender DVT Evaluation: No calf swelling or tenderness Extremities: none edema Skin: warm, dry  Recent Labs    11/26/23 2351 11/27/23 2002  HGB 10.9* 10.2*  HCT 34.3* 31.5*    Assessment/Plan: Linda Edwards is a 38 y.o. G1P1001 s/p pLTCS at [redacted]w[redacted]d   POD# 1 - Doing well  Routine postpartum care  Delivery Complications: gestational hypertension and triple I Blood Pressure: normal, on lasix/K Anemia/Hb Status: Stable, appropriate postpartum Hb, no intervention indicated Contraception: Progesterone-only Oral Contraceptive Pills Feeding: breast feeding Needs Lactation Consultation: Yes    Dispo: Plan for discharge tomorrow.   LOS: 3 days   [name], MD OB Fellow  11/28/2023, 2:04 AM

## 2023-11-28 NOTE — Lactation Note (Signed)
 This note was copied from a baby's chart. Lactation Consultation Note  Patient Name: Linda Edwards Date: 11/28/2023 Age:37 hours Reason for consult: Follow-up assessment;Primapara;1st time breastfeeding;Term;Maternal endocrine disorder;Breastfeeding assistance;RN request  P1- RN requested for LC to assist with infant's positioning. The RN had assisted with latching infant on the right breast in the cross cradle hold, but MOB reported feeling uncomfortable in this position. LC switched infant in the cradle hold, the football hold and the laid back position on both breasts, but MOB reported feeling uncomfortable with all of them. Infant had two bowel movements, two pees and a gagging episode during this consult. After LC changed infant's diaper, she seemed more interested in nursing. LC left infant latched to the left breast in the football hold. Infant had flanged lips and a strong rhythmic pull. MOB reported that the latch felt great but she was still uncomfortable with the position. LC reassured MOB that as days pass by, she will become more and more comfortable with these positions and she will be able to find one that she likes. LC encouraged MOB to call for further assistance as needed.  Maternal Data Has patient been taught Hand Expression?: Yes Does the patient have breastfeeding experience prior to this delivery?: No  Feeding Mother's Current Feeding Choice: Breast Milk  LATCH Score Latch: Repeated attempts needed to sustain latch, nipple held in mouth throughout feeding, stimulation needed to elicit sucking reflex.  Audible Swallowing: Spontaneous and intermittent  Type of Nipple: Everted at rest and after stimulation  Comfort (Breast/Nipple): Soft / non-tender  Hold (Positioning): Full assist, staff holds infant at breast  LATCH Score: 7   Lactation Tools Discussed/Used Pump Education: Milk Storage  Interventions Interventions: Breast feeding basics  reviewed;Assisted with latch;Hand express;Breast compression;Adjust position;Support pillows;Position options;Education;LC Services brochure  Discharge Discharge Education: Engorgement and breast care;Warning signs for feeding baby Pump: DEBP;Personal  Consult Status Consult Status: Follow-up Date: 11/29/23 Follow-up type: In-patient    Recardo Hoit BS, IBCLC 11/28/2023, 10:18 PM

## 2023-11-28 NOTE — Anesthesia Postprocedure Evaluation (Signed)
 Anesthesia Post Note  Patient: Linda Edwards  Procedure(s) Performed: CESAREAN DELIVERY (Abdomen)     Patient location during evaluation: PACU Anesthesia Type: Epidural Level of consciousness: awake Pain management: pain level controlled Vital Signs Assessment: post-procedure vital signs reviewed and stable Respiratory status: spontaneous breathing, nonlabored ventilation and respiratory function stable Cardiovascular status: blood pressure returned to baseline and stable Postop Assessment: no apparent nausea or vomiting Anesthetic complications: no   No notable events documented.  Last Vitals:  Vitals:   11/28/23 0954 11/28/23 1505  BP: (!) 91/56 130/83  Pulse: 77 66  Resp: 18 18  Temp: 36.5 C 36.5 C  SpO2: 99% 99%    Last Pain:  Vitals:   11/28/23 1754  TempSrc:   PainSc: 2    Pain Goal:                   Bernardino SQUIBB Keeghan Bialy

## 2023-11-28 NOTE — Lactation Note (Signed)
 This note was copied from a baby's chart. Lactation Consultation Note  Patient Name: Linda Edwards Unijb'd Date: 11/28/2023 Age:37 hours Reason for consult: Follow-up assessment;1st time breastfeeding;Term;Maternal endocrine disorder  P1, Mother had questions regarding amniotic fluid and how to suction.  Answered questions about frequency, positioning.and cluster feeding. Reviewed feeding cues and offering both breasts. Suggest calling for help with latching as needed.  Feed on demand with cues.  Goal 8-12+ times per day after first 24 hrs.  Place baby STS if not cueing.    Maternal Data Has patient been taught Hand Expression?: Yes Does the patient have breastfeeding experience prior to this delivery?: No  Feeding Mother's Current Feeding Choice: Breast Milk  Interventions Interventions: Breast feeding basics reviewed;Education  Discharge Pump: Personal;DEBP  Consult Status Consult Status: Follow-up Date: 11/29/23 Follow-up type: In-patient   Shannon Levorn Lemme  RN, IBCLC 11/28/2023, 9:33 AM

## 2023-11-28 NOTE — Patient Instructions (Signed)
 Your appointment with Outpatient Lactation is: Date:12/14/2023 Time:11:30am [facility] (First Floor) [address] [place]  Check in under baby's name.  Please bring your baby hungry along with your pump and a bottle of either formula or expressed breast milk. Please also bring your pump flanges and we welcome support people! If you need lactation assistance before your appointment, please call [phone] for lactation voice mail.   -- [facility] Lactation Support Group  Please join us  for our [facility] [employer] Lactation Support Group at [employer] [facility] We meet every Tuesday at 10:00 am to 12:00 pm at [employer] on the second floor in the conference room Lactating parents and lap babies are welcome, no registration is required, if you have a lactation pillow please bring

## 2023-11-29 ENCOUNTER — Other Ambulatory Visit (HOSPITAL_COMMUNITY): Payer: Self-pay

## 2023-11-29 ENCOUNTER — Encounter: Admitting: Obstetrics and Gynecology

## 2023-11-29 DIAGNOSIS — D62 Acute posthemorrhagic anemia: Secondary | ICD-10-CM | POA: Diagnosis not present

## 2023-11-29 DIAGNOSIS — Z98891 History of uterine scar from previous surgery: Secondary | ICD-10-CM

## 2023-11-29 MED ORDER — ACETAMINOPHEN 500 MG PO TABS
1000.0000 mg | ORAL_TABLET | Freq: Four times a day (QID) | ORAL | 0 refills | Status: AC | PRN
  Filled 2023-11-29: qty 60, 8d supply, fill #0

## 2023-11-29 MED ORDER — OXYCODONE HCL 5 MG PO TABS
5.0000 mg | ORAL_TABLET | ORAL | 0 refills | Status: AC | PRN
  Filled 2023-11-29: qty 20, 4d supply, fill #0

## 2023-11-29 MED ORDER — POTASSIUM CHLORIDE CRYS ER 20 MEQ PO TBCR
20.0000 meq | EXTENDED_RELEASE_TABLET | Freq: Every day | ORAL | 0 refills | Status: AC
  Filled 2023-11-29: qty 3, 3d supply, fill #0

## 2023-11-29 MED ORDER — FERROUS SULFATE 325 (65 FE) MG PO TABS
325.0000 mg | ORAL_TABLET | ORAL | 0 refills | Status: AC
  Filled 2023-11-29: qty 90, 180d supply, fill #0

## 2023-11-29 MED ORDER — IBUPROFEN 600 MG PO TABS
600.0000 mg | ORAL_TABLET | Freq: Four times a day (QID) | ORAL | 0 refills | Status: AC
  Filled 2023-11-29: qty 30, 8d supply, fill #0

## 2023-11-29 MED ORDER — FUROSEMIDE 20 MG PO TABS
20.0000 mg | ORAL_TABLET | Freq: Every day | ORAL | 0 refills | Status: AC
  Filled 2023-11-29: qty 3, 3d supply, fill #0

## 2023-11-29 NOTE — Progress Notes (Addendum)
 POSTPARTUM PROGRESS NOTE  Post Operative Day 2  Subjective:  Tajuanna Medine is a 38 y.o. G1P1001 s/p pLTCS at [redacted]w[redacted]d.  She reports she is doing well. No acute events overnight. She denies any problems with ambulating, voiding or po intake. Denies nausea or vomiting.  Pain is well controlled.  Lochia is Normal.  Objective: Blood pressure 130/73, pulse 61, temperature 97.9 F (36.6 C), temperature source Oral, resp. rate 14, height 5' 3 (1.6 m), weight 93.4 kg, last menstrual period 02/03/2023, SpO2 100%, unknown if currently breastfeeding.  BP Readings from Last 3 Encounters:  11/29/23 130/73  11/22/23 130/88  11/15/23 132/83    Physical Exam:  General: alert, cooperative and no distress Chest: no respiratory distress Heart:regular rate, distal pulses intact Uterine Fundus: firm, appropriately tender Extremities: no edema Skin: warm, dry  Recent Labs    11/27/23 2002 11/28/23 0500  HGB 10.2* 9.2*  HCT 31.5* 28.4*    Assessment/Plan: Drew Herman is a 38 y.o. G1P1001 s/p pLTCS at [redacted]w[redacted]d   POD# 2 - Doing well  Routine postpartum care  Delivery Complications: gestational hypertension and triple I  Blood Pressure: normal, on lasix/K Anemia/Hb Status: Low, start PO ferrous sulfate Started on oral iron therapy for asymptomatic but clinically significant postpartum acute blood loss anemia. Contraception: OCPs Feeding: breast feeding   Dispo: Plan for discharge today or tomorrow.   LOS: 4 days   [name], MD OB Fellow  11/29/2023, 8:40 AM

## 2023-11-29 NOTE — Lactation Note (Signed)
 This note was copied from a baby's chart. Lactation Consultation Note  Patient Name: Linda Edwards Date: 11/29/2023 Age:37 hours Reason for consult: Initial assessment;Primapara;Term  P1. Mom called for assistance d/t to painful for BF. When LC came into rm. Baby was BF. LC just moved baby's body up towards mom. Mom stated felt like baby is biting and chomping. LC used gloved finger to assess suck and baby clamping down hard w/gum. Stimulated to suck and felt both top and lower gums. Baby has high palate and thick labial frenulum. Noted tongue curls back.  Got NS applied mom stated it was no better. Suggested mom pump and bottle feed to let nipples rest. Baby is wanting to cluster feed and mom's nipples are sore. Lt. Nipple has positional stripe. Both look red and tender. Applied #20 NS. Mom still wasn't able to tolerate the baby latching. Baby had wide open flange. Suggested mom pump and bottle feed and let her nipples rest to see if that helps. Asked RN to get mom some coconut oil. Mom applied. Mom shown how to use DEBP & how to disassemble, clean, & reassemble parts. Mom was pumping when left. Mom needed #18 flange. Mom has DEBP but doesn't have proper size. She will order them. Mom stated if she doesn't have enough colostrum she would like DBM to give to baby. No DBM available so mom is giving formula.  Maternal Data Does the patient have breastfeeding experience prior to this delivery?: No  Feeding    LATCH Score Latch: Grasps breast easily, tongue down, lips flanged, rhythmical sucking.  Audible Swallowing: None  Type of Nipple: Everted at rest and after stimulation  Comfort (Breast/Nipple): Filling, red/small blisters or bruises, mild/mod discomfort  Hold (Positioning): No assistance needed to correctly position infant at breast.  LATCH Score: 7   Lactation Tools Discussed/Used Tools: Shells;Pump;Flanges;Coconut oil;Nipple Shields Nipple shield size:  20 Flange Size: 18 Breast pump type: Double-Electric Breast Pump Pump Education: Setup, frequency, and cleaning;Milk Storage Reason for Pumping: nipple pain/hurts to bad to BF Pumping frequency: q 3hr  Interventions Interventions: Breast feeding basics reviewed;Skin to skin;Adjust position;Support pillows;Position options;Coconut oil;Shells;DEBP;Education  Discharge    Consult Status Consult Status: Follow-up Date: 11/30/23 Follow-up type: In-patient    Tiwan Schnitker G 11/29/2023, 11:49 PM

## 2023-11-29 NOTE — Lactation Note (Signed)
 This note was copied from a baby's chart. Lactation Consultation Note  Patient Name: Linda Edwards Date: 11/29/2023 Age:37 hours Reason for consult: Follow-up assessment;Mother's request;Primapara;1st time breastfeeding;Term;Maternal endocrine disorder;Breastfeeding assistance  P1- MOB called out for latching assistance. When Lc entered the room, MOB had infant latched to the right breast in the football hold. Infant was actively sucking in a rhythmic pattern. LC praised MOB and infant. Infant nursed for 8 minutes, then LC swaddled her and placed her in her crib. LC encouraged MOB to call for further assistance as needed.  Maternal Data Has patient been taught Hand Expression?: Yes Does the patient have breastfeeding experience prior to this delivery?: No  Feeding Mother's Current Feeding Choice: Breast Milk  LATCH Score Latch: Repeated attempts needed to sustain latch, nipple held in mouth throughout feeding, stimulation needed to elicit sucking reflex.  Audible Swallowing: Spontaneous and intermittent  Type of Nipple: Everted at rest and after stimulation  Comfort (Breast/Nipple): Soft / non-tender  Hold (Positioning): Assistance needed to correctly position infant at breast and maintain latch.  LATCH Score: 8   Lactation Tools Discussed/Used Pump Education: Milk Storage  Interventions Interventions: Breast feeding basics reviewed;Assisted with latch;Breast compression;Adjust position;Support pillows;Position options;Education;LC Services brochure  Discharge Discharge Education: Engorgement and breast care;Warning signs for feeding baby Pump: DEBP;Personal  Consult Status Consult Status: Follow-up Date: 11/29/23 Follow-up type: In-patient    Recardo Hoit BS, IBCLC 11/29/2023, 1:24 AM

## 2023-11-30 ENCOUNTER — Other Ambulatory Visit

## 2023-11-30 ENCOUNTER — Encounter: Admitting: Family Medicine

## 2023-11-30 LAB — SURGICAL PATHOLOGY

## 2023-11-30 NOTE — Discharge Instructions (Signed)

## 2023-11-30 NOTE — Lactation Note (Signed)
 This note was copied from a baby's chart. Lactation Consultation Note  Patient Name: Linda Edwards Date: 11/30/2023 Age:37 hours, P1  Reason for consult: Follow-up assessment;Primapara;1st time breastfeeding;Term;Infant weight loss;Nipple pain/trauma;Maternal endocrine disorder (9 % weight loss,) Per mom not latching the baby due to sore nipples ( both ) and to painful to latch.  LC offered to assess and mom receptive.  LC noted small positional strips on both nipples, both intact.  Mom already has the coconut oil and shells and a DEBP .  LC reviewed LC plan for D/C sore nipples and 9 % weight loss.  Breast shells between feedings except when sleeping.  For today coconut oil with pumping and pump around feeding times while dad is pace feeding the baby ( as explained)  Baby needs to feed with cues and by 3 hours and pump like a feeding.  When the milk is coming in and the sore nipples are clearing  attempt to latch .  Since the baby has had a a bottle, may need to feed the baby a small appetizer of EBM or formula so the baby is calmer latching,  If the other nipple is healed and baby is hungry offer the 2nd breast.  If baby is satisfied and fed well softening down the breast, hold off on the supplementing. If not supplement 30 ml of EBM or formula.  LC reviewed engorgement prevention and tx, storage of breastmilk and LC resources.  11/14 - 11:30 am for [facility] O/P apt at [facility]  Per dad may consider calling [facility] to have a apt sooner.  Maternal Data Has patient been taught Hand Expression?: Yes Does the patient have breastfeeding experience prior to this delivery?: No  Feeding Mother's Current Feeding Choice: Breast Milk Nipple Type: Slow - flow  LATCH Score - to sore to latch     Lactation Tools Discussed/Used Tools: Shells;Pump;Flanges Nipple shield size: Other (comment) (per mom not using it) Flange Size: 18;21 Breast pump type: Manual;Double-Electric  Breast Pump Pump Education: Milk Storage;Setup, frequency, and cleaning  Interventions Interventions: Breast feeding basics reviewed;Coconut oil;Shells;Hand pump;DEBP;Education;LC Services brochure;CDC milk storage guidelines;CDC Guidelines for Breast Pump Cleaning  Discharge Discharge Education: Engorgement and breast care;Warning signs for feeding baby;Outpatient recommendation;Other (comment) (per parents has LC O/P apt in 3 weeks. Per dad plans to call [facility] for a sooner LC apt at home) Pump: DEBP;Personal;Manual (per mom DEBP Spectra  - dad ordered the flange inserts) 11/14 - 11:30 am for [facility] O/P apt at [facility]  Consult Status Consult Status: Complete Date: 11/30/23    Rollene Jenkins Fiedler 11/30/2023, 12:52 PM

## 2023-12-01 ENCOUNTER — Encounter (HOSPITAL_COMMUNITY)

## 2023-12-04 ENCOUNTER — Ambulatory Visit (INDEPENDENT_AMBULATORY_CARE_PROVIDER_SITE_OTHER)

## 2023-12-04 VITALS — BP 108/77 | HR 103 | Wt 186.0 lb

## 2023-12-04 DIAGNOSIS — Z0130 Encounter for examination of blood pressure without abnormal findings: Secondary | ICD-10-CM

## 2023-12-04 DIAGNOSIS — Z4889 Encounter for other specified surgical aftercare: Secondary | ICD-10-CM

## 2023-12-04 NOTE — Progress Notes (Unsigned)
 Incision Check Visit  Linda Edwards is here for incision check following primary c-section on 11/27/23.   Assessment: Incision is Clean dry and intact; there is a scant amount of redness in the center of the incision.  There is no drainage.  The patient reports some slight irritation possibly from sweat.  There is no odor.  Patient instructed to let the office know if the incision begins to hurt more, have drainage, or have an odor; patient verbalized understanding.  MAU precautions reviewed as well.    Education: Reviewed good wound care and s/s of infection with patient.  [name] is also here for blood pressure check.  BP today is 108/77. Patient denies any dizzness, blurred vision, headache , elevated BP measurement, shortness of breath or peripheral edema.  Patient will follow-up with a postpartum appointment on 01/08/2024.  Rosina, RN

## 2023-12-10 ENCOUNTER — Encounter: Payer: Self-pay | Admitting: Family Medicine

## 2023-12-12 ENCOUNTER — Ambulatory Visit: Admitting: *Deleted

## 2023-12-12 VITALS — BP 116/83 | HR 102

## 2023-12-12 DIAGNOSIS — Z98891 History of uterine scar from previous surgery: Secondary | ICD-10-CM

## 2023-12-12 MED ORDER — CEPHALEXIN 500 MG PO CAPS: 500.0000 mg | ORAL_CAPSULE | Freq: Four times a day (QID) | ORAL | 0 refills | Status: AC

## 2023-12-12 NOTE — Progress Notes (Signed)
 Subjective:     Linda Edwards is a 38 y.o. female who presents to the clinic 2 weeks status post low uterine, transverse cesarean section. Pt reports incision is she has a red tender spot above her incision line on the right side.     Objective:    BP 116/83   Pulse (!) 102   LMP 02/03/2023  General:  alert, well appearing, in no apparent distress  Incision:   healing well, no drainage, no swelling, no dehiscence, incision well approximated. Red area is tender and warm to touch.     To have CNM take a look at area Assessment:    Doing well postoperatively.    Plan:    1. Continue any current medications. 2. Wound care discussed. 3. Follow up: as needed and or at postpartum visit .   Wanda Buckles, RN

## 2023-12-19 DIAGNOSIS — E890 Postprocedural hypothyroidism: Secondary | ICD-10-CM | POA: Diagnosis not present

## 2024-01-08 ENCOUNTER — Encounter: Payer: Self-pay | Admitting: Family Medicine

## 2024-01-08 ENCOUNTER — Ambulatory Visit: Admitting: Family Medicine

## 2024-01-08 VITALS — BP 116/78 | HR 82 | Wt 173.2 lb

## 2024-01-08 DIAGNOSIS — Z30011 Encounter for initial prescription of contraceptive pills: Secondary | ICD-10-CM | POA: Diagnosis not present

## 2024-01-08 DIAGNOSIS — Z1331 Encounter for screening for depression: Secondary | ICD-10-CM | POA: Diagnosis not present

## 2024-01-08 MED ORDER — NORETHINDRONE 0.35 MG PO TABS: 1.0000 | ORAL_TABLET | Freq: Every day | ORAL | 3 refills | Status: AC

## 2024-01-08 NOTE — Progress Notes (Signed)
 Post Partum Visit Note  Linda Edwards is a 38 y.o. G3P1001 female who presents for a postpartum visit. She is 5 weeks postpartum following a primary cesarean section.  I have fully reviewed the prenatal and intrapartum course. The delivery was at 40.1 gestational weeks.  Anesthesia: epidural. Postpartum course has been pretty good.Linda Edwards is doing well. Baby is feeding by breast. Bleeding staining only. Bowel function is normal. Bladder function is normal. Patient is not sexually active. Contraception method is none. Patient wants to start back on birth control. Postpartum depression screening: negative.   The pregnancy intention screening data noted above was reviewed. Potential methods of contraception were discussed. The patient elected to proceed with No data recorded.   Edinburgh Postnatal Depression Scale - 01/08/24 1339       Edinburgh Postnatal Depression Scale:  In the Past 7 Days   I have been able to laugh and see the funny side of things. 0    I have looked forward with enjoyment to things. 0    I have blamed myself unnecessarily when things went wrong. 0    I have been anxious or worried for no good reason. 0    I have felt scared or panicky for no good reason. 0    Things have been getting on top of me. 0    I have been so unhappy that I have had difficulty sleeping. 0    I have felt sad or miserable. 0    I have been so unhappy that I have been crying. 0    The thought of harming myself has occurred to me. 0    Edinburgh Postnatal Depression Scale Total 0          Health Maintenance Due  Topic Date Due   COVID-19 Vaccine (1) Never done   Hepatitis B Vaccines 19-59 Average Risk (1 of 3 - 19+ 3-dose series) Never done   HPV VACCINES (1 - Risk 3-dose SCDM series) Never done   Influenza Vaccine  Never done    The following portions of the patient's history were reviewed and updated as appropriate: allergies, current medications, past family history, past medical  history, past social history, past surgical history, and problem list.  Review of Systems Pertinent items noted in HPI and remainder of comprehensive ROS otherwise negative.  Objective:  BP 116/78   Pulse 82   Wt 173 lb 3 oz (78.6 kg)   LMP 02/03/2023   BMI 30.68 kg/m    General:  alert, cooperative, and appears stated age   Breasts:  not indicated  Lungs: Normal effort  Heart:  regular rate and rhythm  Abdomen: soft, non-tender; bowel sounds normal; no masses,  no organomegaly   Wound well approximated incision  GU exam:  not indicated       Assessment:   Normal postpartum exam.   Plan:   Essential components of care per ACOG recommendations:  1.  Mood and well being: Patient with negative depression screening today. Reviewed local resources for support.  - Patient tobacco use? No.   - hx of drug use? No.    2. Infant care and feeding:  -Patient currently breastmilk feeding? Yes. Discussed returning to work and pumping.  -Social determinants of health (SDOH) reviewed in EPIC. No concerns  3. Sexuality, contraception and birth spacing - Patient does not want a pregnancy in the next year.  Desired family size is 1 children.  - Reviewed reproductive life planning. Reviewed contraceptive  methods based on pt preferences and effectiveness.  Patient desired Oral Contraceptive today.   - Discussed birth spacing of 18 months  4. Sleep and fatigue -Encouraged family/partner/community support of 4 hrs of uninterrupted sleep to help with mood and fatigue  5. Physical Recovery  - Discussed patients delivery and complications. She describes her labor as mixed. - Patient had a C-section failure to progress.  Perineal healing reviewed. Patient expressed understanding - Patient has urinary incontinence? No. - Patient is safe to resume physical and sexual activity  6.  Health Maintenance - HM due items addressed Yes - Last pap smear 05/02/2023 HPV negative NILM  Pap smear not  done at today's visit.  -Breast Cancer screening indicated? No.   7. Chronic Disease/Pregnancy Condition follow up: Hypothyroidism  - PCP follow up  Glenys GORMAN Birk, MD [facility], [facility]

## 2024-01-21 DIAGNOSIS — C73 Malignant neoplasm of thyroid gland: Secondary | ICD-10-CM | POA: Diagnosis not present

## 2024-01-21 DIAGNOSIS — E890 Postprocedural hypothyroidism: Secondary | ICD-10-CM | POA: Diagnosis not present

## 2024-01-21 DIAGNOSIS — E282 Polycystic ovarian syndrome: Secondary | ICD-10-CM | POA: Diagnosis not present

## 2024-05-06 ENCOUNTER — Ambulatory Visit: Admitting: Family Medicine
